# Patient Record
Sex: Female | Born: 1965 | Race: White | Hispanic: No | Marital: Married | Smoking: Never smoker
Health system: Southern US, Community
[De-identification: ages and names within clinical notes are randomized; demographics above are authoritative.]

## PROBLEM LIST (undated history)

## (undated) DIAGNOSIS — L509 Urticaria, unspecified: Secondary | ICD-10-CM

## (undated) DIAGNOSIS — E785 Hyperlipidemia, unspecified: Secondary | ICD-10-CM

## (undated) DIAGNOSIS — M1990 Unspecified osteoarthritis, unspecified site: Secondary | ICD-10-CM

## (undated) DIAGNOSIS — K649 Unspecified hemorrhoids: Secondary | ICD-10-CM

## (undated) DIAGNOSIS — K219 Gastro-esophageal reflux disease without esophagitis: Secondary | ICD-10-CM

## (undated) DIAGNOSIS — T7840XA Allergy, unspecified, initial encounter: Secondary | ICD-10-CM

## (undated) DIAGNOSIS — E039 Hypothyroidism, unspecified: Secondary | ICD-10-CM

## (undated) DIAGNOSIS — Z9289 Personal history of other medical treatment: Secondary | ICD-10-CM

## (undated) HISTORY — DX: Hypothyroidism, unspecified: E03.9

## (undated) HISTORY — DX: Personal history of other medical treatment: Z92.89

## (undated) HISTORY — DX: Hyperlipidemia, unspecified: E78.5

## (undated) HISTORY — DX: Allergy, unspecified, initial encounter: T78.40XA

## (undated) HISTORY — DX: Gastro-esophageal reflux disease without esophagitis: K21.9

## (undated) HISTORY — DX: Unspecified osteoarthritis, unspecified site: M19.90

## (undated) HISTORY — PX: OTHER SURGICAL HISTORY: SHX169

## (undated) HISTORY — DX: Unspecified hemorrhoids: K64.9

## (undated) HISTORY — DX: Urticaria, unspecified: L50.9

---

## 2003-07-02 ENCOUNTER — Other Ambulatory Visit: Admission: RE | Admit: 2003-07-02 | Discharge: 2003-07-02 | Payer: Self-pay | Admitting: Family Medicine

## 2003-12-10 ENCOUNTER — Encounter: Admission: RE | Admit: 2003-12-10 | Discharge: 2003-12-10 | Payer: Self-pay | Admitting: Family Medicine

## 2004-06-23 ENCOUNTER — Ambulatory Visit: Payer: Self-pay | Admitting: Family Medicine

## 2004-07-20 ENCOUNTER — Ambulatory Visit: Payer: Self-pay | Admitting: Family Medicine

## 2004-07-20 ENCOUNTER — Encounter: Admission: RE | Admit: 2004-07-20 | Discharge: 2004-07-20 | Payer: Self-pay | Admitting: Family Medicine

## 2004-08-18 ENCOUNTER — Ambulatory Visit: Payer: Self-pay | Admitting: Family Medicine

## 2004-10-04 ENCOUNTER — Ambulatory Visit: Payer: Self-pay | Admitting: Family Medicine

## 2004-10-19 ENCOUNTER — Ambulatory Visit: Payer: Self-pay | Admitting: Family Medicine

## 2004-10-26 ENCOUNTER — Ambulatory Visit: Payer: Self-pay | Admitting: Family Medicine

## 2005-01-25 ENCOUNTER — Encounter: Admission: RE | Admit: 2005-01-25 | Discharge: 2005-01-25 | Payer: Self-pay | Admitting: Family Medicine

## 2005-01-25 ENCOUNTER — Ambulatory Visit: Payer: Self-pay | Admitting: Family Medicine

## 2005-05-26 ENCOUNTER — Ambulatory Visit: Payer: Self-pay | Admitting: Family Medicine

## 2006-05-01 ENCOUNTER — Ambulatory Visit: Payer: Self-pay | Admitting: Family Medicine

## 2006-05-01 LAB — CONVERTED CEMR LAB
AST: 21 units/L (ref 0–37)
Albumin: 3.9 g/dL (ref 3.5–5.2)
Alkaline Phosphatase: 85 units/L (ref 39–117)
TSH: 3.47 microintl units/mL (ref 0.35–5.50)
Total Bilirubin: 0.6 mg/dL (ref 0.3–1.2)

## 2006-08-08 ENCOUNTER — Ambulatory Visit: Payer: Self-pay | Admitting: Family Medicine

## 2006-08-08 LAB — CONVERTED CEMR LAB
BUN: 11 mg/dL (ref 6–23)
Calcium: 9.3 mg/dL (ref 8.4–10.5)
Eosinophils Absolute: 0.1 10*3/uL (ref 0.0–0.6)
GFR calc Af Amer: 101 mL/min
GFR calc non Af Amer: 83 mL/min
Glucose, Bld: 80 mg/dL (ref 70–99)
Lymphocytes Relative: 20.1 % (ref 12.0–46.0)
MCHC: 34.8 g/dL (ref 30.0–36.0)
MCV: 84.3 fL (ref 78.0–100.0)
Monocytes Relative: 7.7 % (ref 3.0–11.0)
Neutro Abs: 4.8 10*3/uL (ref 1.4–7.7)
Platelets: 203 10*3/uL (ref 150–400)

## 2006-12-11 ENCOUNTER — Ambulatory Visit: Payer: Self-pay | Admitting: Family Medicine

## 2006-12-20 ENCOUNTER — Telehealth (INDEPENDENT_AMBULATORY_CARE_PROVIDER_SITE_OTHER): Payer: Self-pay | Admitting: *Deleted

## 2007-02-11 ENCOUNTER — Telehealth (INDEPENDENT_AMBULATORY_CARE_PROVIDER_SITE_OTHER): Payer: Self-pay | Admitting: *Deleted

## 2007-02-14 ENCOUNTER — Ambulatory Visit: Payer: Self-pay | Admitting: Internal Medicine

## 2007-02-14 DIAGNOSIS — T7840XA Allergy, unspecified, initial encounter: Secondary | ICD-10-CM | POA: Insufficient documentation

## 2007-02-20 ENCOUNTER — Telehealth: Payer: Self-pay | Admitting: Internal Medicine

## 2007-03-01 ENCOUNTER — Telehealth: Payer: Self-pay | Admitting: Internal Medicine

## 2007-03-04 ENCOUNTER — Ambulatory Visit: Payer: Self-pay | Admitting: Family Medicine

## 2007-03-07 ENCOUNTER — Telehealth (INDEPENDENT_AMBULATORY_CARE_PROVIDER_SITE_OTHER): Payer: Self-pay | Admitting: *Deleted

## 2007-03-26 ENCOUNTER — Ambulatory Visit: Payer: Self-pay | Admitting: Family Medicine

## 2007-03-26 DIAGNOSIS — E785 Hyperlipidemia, unspecified: Secondary | ICD-10-CM

## 2007-04-04 ENCOUNTER — Telehealth: Payer: Self-pay | Admitting: Family Medicine

## 2007-04-05 ENCOUNTER — Ambulatory Visit: Payer: Self-pay | Admitting: Family Medicine

## 2007-04-08 ENCOUNTER — Telehealth (INDEPENDENT_AMBULATORY_CARE_PROVIDER_SITE_OTHER): Payer: Self-pay | Admitting: *Deleted

## 2007-04-08 LAB — CONVERTED CEMR LAB: Free T4: 0.9 ng/dL (ref 0.6–1.6)

## 2007-04-12 DIAGNOSIS — R945 Abnormal results of liver function studies: Secondary | ICD-10-CM | POA: Insufficient documentation

## 2007-04-22 ENCOUNTER — Encounter: Admission: RE | Admit: 2007-04-22 | Discharge: 2007-04-22 | Payer: Self-pay | Admitting: Family Medicine

## 2007-04-25 DIAGNOSIS — D1803 Hemangioma of intra-abdominal structures: Secondary | ICD-10-CM

## 2007-05-09 ENCOUNTER — Ambulatory Visit: Payer: Self-pay | Admitting: Family Medicine

## 2007-05-10 ENCOUNTER — Encounter: Admission: RE | Admit: 2007-05-10 | Discharge: 2007-05-10 | Payer: Self-pay | Admitting: Family Medicine

## 2007-05-11 LAB — CONVERTED CEMR LAB: Creatinine, Ser: 0.9 mg/dL (ref 0.4–1.2)

## 2007-05-14 ENCOUNTER — Telehealth (INDEPENDENT_AMBULATORY_CARE_PROVIDER_SITE_OTHER): Payer: Self-pay | Admitting: *Deleted

## 2007-06-05 ENCOUNTER — Ambulatory Visit: Payer: Self-pay | Admitting: Family Medicine

## 2007-06-10 ENCOUNTER — Encounter (INDEPENDENT_AMBULATORY_CARE_PROVIDER_SITE_OTHER): Payer: Self-pay | Admitting: *Deleted

## 2007-06-10 LAB — CONVERTED CEMR LAB: TSH: 2.91 microintl units/mL (ref 0.35–5.50)

## 2007-06-20 ENCOUNTER — Encounter: Payer: Self-pay | Admitting: Family Medicine

## 2007-07-10 ENCOUNTER — Ambulatory Visit: Payer: Self-pay | Admitting: Family Medicine

## 2007-07-11 ENCOUNTER — Encounter (INDEPENDENT_AMBULATORY_CARE_PROVIDER_SITE_OTHER): Payer: Self-pay | Admitting: *Deleted

## 2007-08-02 ENCOUNTER — Encounter: Payer: Self-pay | Admitting: Family Medicine

## 2007-10-09 ENCOUNTER — Telehealth (INDEPENDENT_AMBULATORY_CARE_PROVIDER_SITE_OTHER): Payer: Self-pay | Admitting: *Deleted

## 2007-10-15 ENCOUNTER — Ambulatory Visit: Payer: Self-pay | Admitting: Internal Medicine

## 2007-10-15 LAB — CONVERTED CEMR LAB: HDL goal, serum: 50 mg/dL

## 2008-02-21 ENCOUNTER — Ambulatory Visit: Payer: Self-pay | Admitting: Family Medicine

## 2008-02-21 DIAGNOSIS — E039 Hypothyroidism, unspecified: Secondary | ICD-10-CM | POA: Insufficient documentation

## 2008-02-21 DIAGNOSIS — J019 Acute sinusitis, unspecified: Secondary | ICD-10-CM

## 2008-02-21 DIAGNOSIS — L500 Allergic urticaria: Secondary | ICD-10-CM

## 2008-02-21 LAB — CONVERTED CEMR LAB
Albumin: 3.8 g/dL (ref 3.5–5.2)
Alkaline Phosphatase: 65 units/L (ref 39–117)
CO2: 28 meq/L (ref 19–32)
Chloride: 103 meq/L (ref 96–112)
Free T4: 1.3 ng/dL (ref 0.6–1.6)
GFR calc Af Amer: 77 mL/min
GFR calc non Af Amer: 64 mL/min
Glucose, Bld: 82 mg/dL (ref 70–99)
Lymphocytes Relative: 25.4 % (ref 12.0–46.0)
Monocytes Absolute: 0.6 10*3/uL (ref 0.1–1.0)
Monocytes Relative: 7 % (ref 3.0–12.0)
Neutrophils Relative %: 66.1 % (ref 43.0–77.0)
Platelets: 264 10*3/uL (ref 150–400)
Potassium: 3.5 meq/L (ref 3.5–5.1)
RDW: 12.5 % (ref 11.5–14.6)
Sodium: 137 meq/L (ref 135–145)
T3, Free: 2.4 pg/mL (ref 2.3–4.2)
Total Protein: 6.3 g/dL (ref 6.0–8.3)

## 2008-02-24 ENCOUNTER — Encounter (INDEPENDENT_AMBULATORY_CARE_PROVIDER_SITE_OTHER): Payer: Self-pay | Admitting: *Deleted

## 2008-03-26 ENCOUNTER — Telehealth (INDEPENDENT_AMBULATORY_CARE_PROVIDER_SITE_OTHER): Payer: Self-pay | Admitting: *Deleted

## 2008-06-03 ENCOUNTER — Ambulatory Visit: Payer: Self-pay | Admitting: Family Medicine

## 2008-06-03 DIAGNOSIS — M7710 Lateral epicondylitis, unspecified elbow: Secondary | ICD-10-CM | POA: Insufficient documentation

## 2008-06-04 ENCOUNTER — Encounter (INDEPENDENT_AMBULATORY_CARE_PROVIDER_SITE_OTHER): Payer: Self-pay | Admitting: *Deleted

## 2008-06-17 ENCOUNTER — Telehealth (INDEPENDENT_AMBULATORY_CARE_PROVIDER_SITE_OTHER): Payer: Self-pay | Admitting: *Deleted

## 2008-07-20 IMAGING — US US ABDOMEN COMPLETE
1 series · 14 of 25 positions shown · non-contrast
Comparison: none

CLINICAL DATA: Abnormal LFTs.
ABDOMEN ULTRASOUND:
TECHNIQUE: Complete abdominal ultrasound examination was performed including evaluation of the liver, gallbladder, bile ducts, pancreas, kidneys, spleen, IVC, and abdominal aorta.
The gallbladder is well seen and no gallstones are noted.  The liver has a normal echogenic pattern with the exception of a single focus of increased echogenicity posteriorly in the right lobe most consistent with hemangioma of 2.4 x 1.8 x 2.0 cm.  The common bile duct is normal measuring 3.5 mm in diameter.  The IVC, pancreas, and spleen appear normal.  No hydronephrosis is noted.  The right kidney measures 11.5 cm sagittally with the left kidney measuring 12.3 cm.  The abdominal aorta is normal in caliber.

[Series 1: us abdomen complete · 0.37mm/px · 14 of 112 slices shown]
[im 1/112]
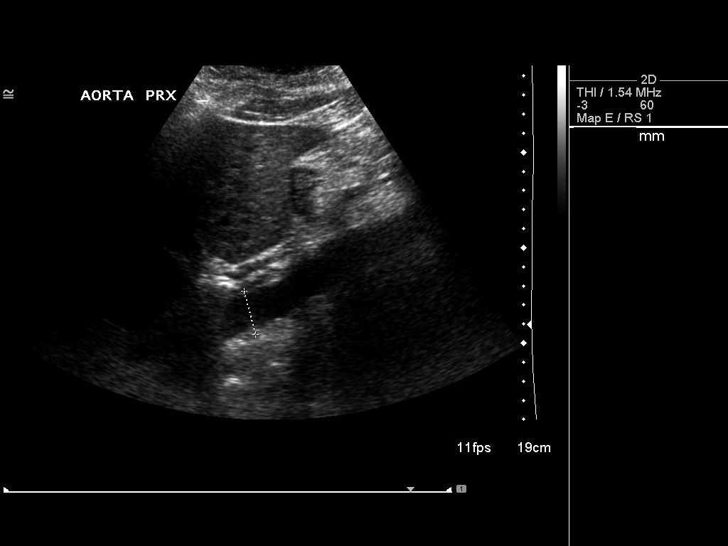
[im 10/112]
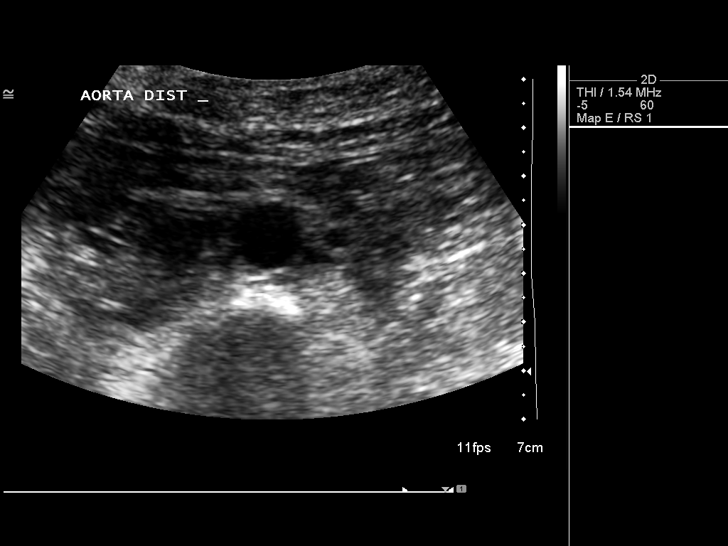
[im 19/112]
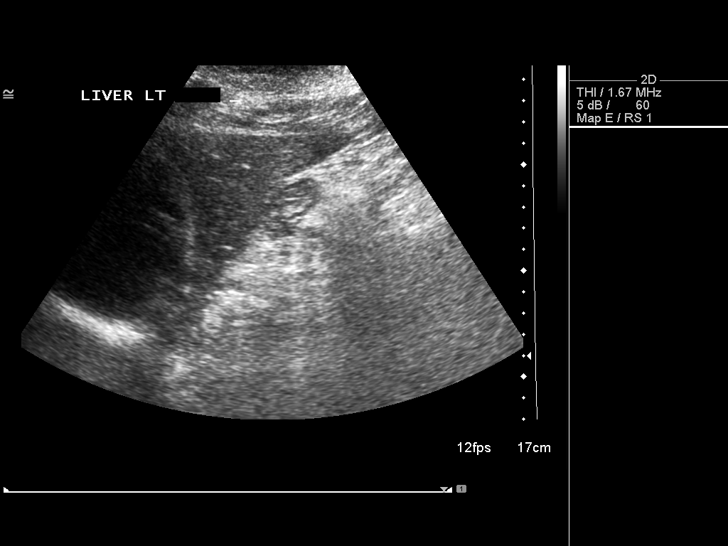
[im 28/112]
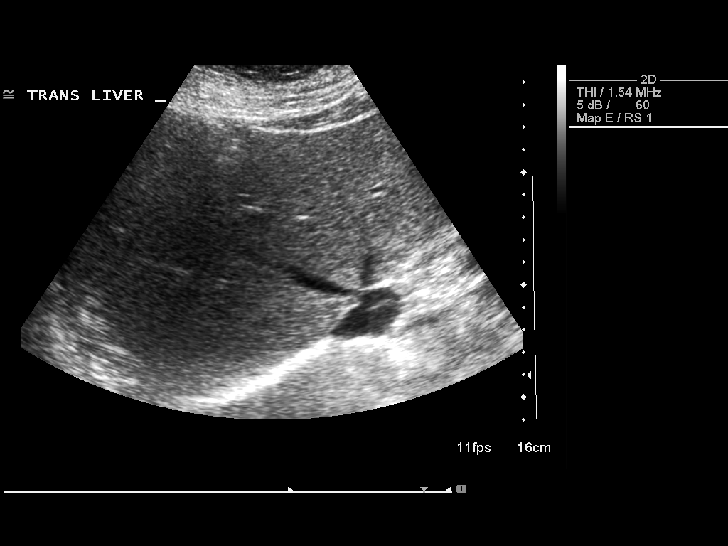
[im 38/112]
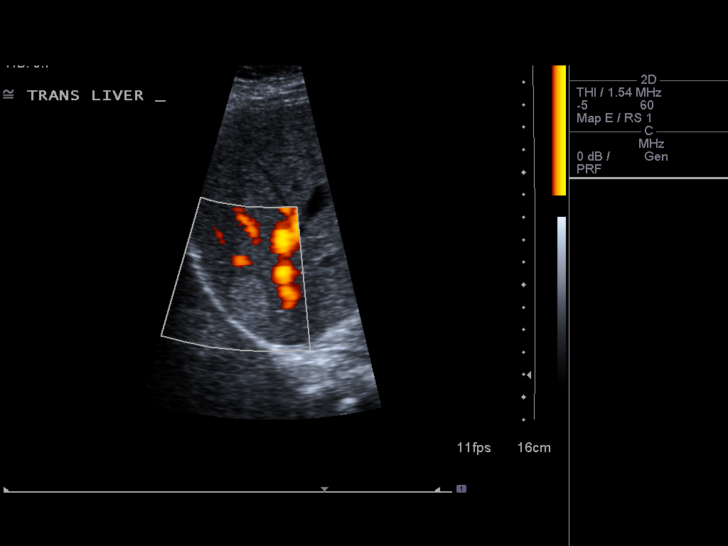
[im 42/112]
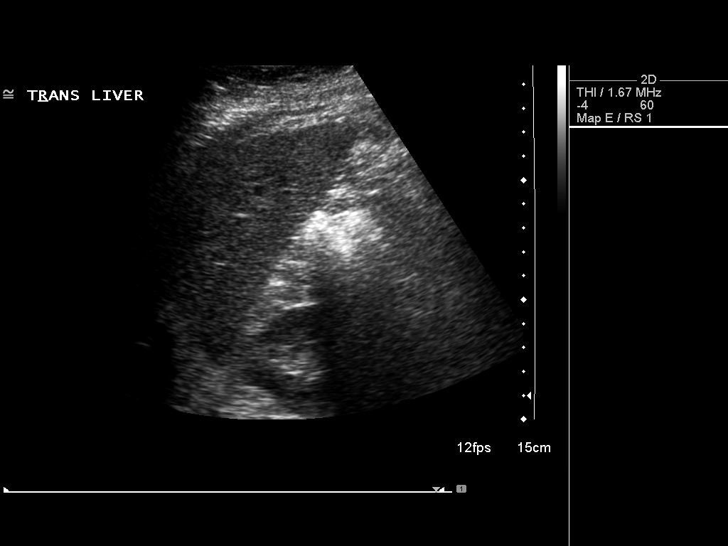
[im 51/112]
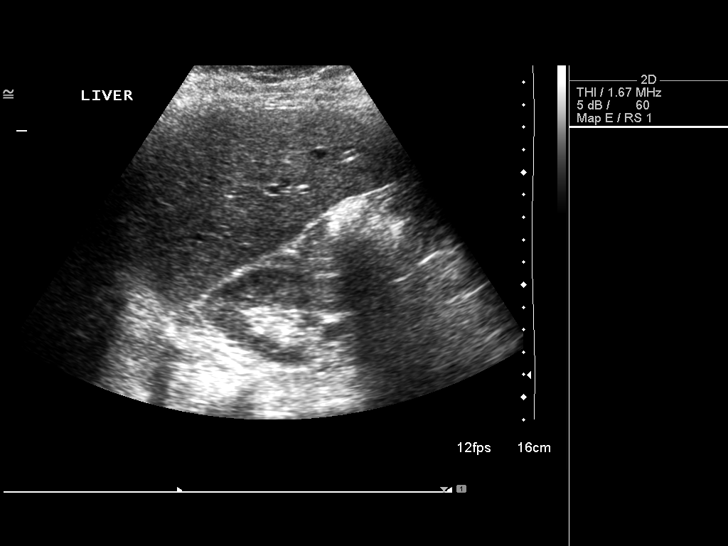
[im 61/112]
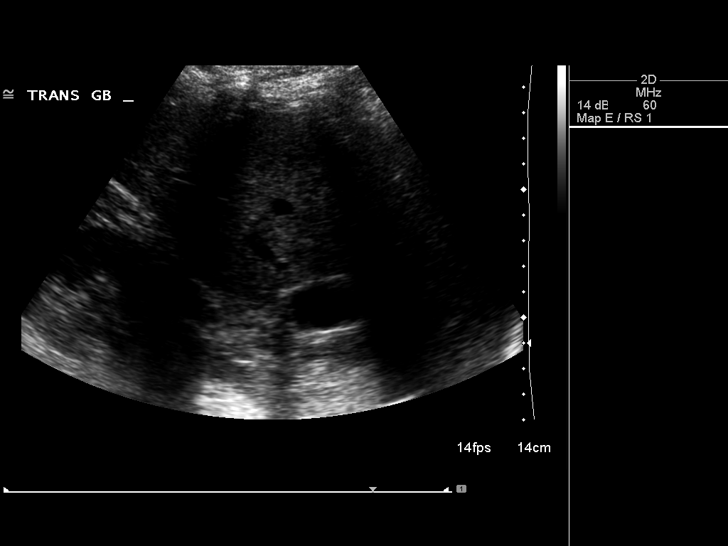
[im 70/112]
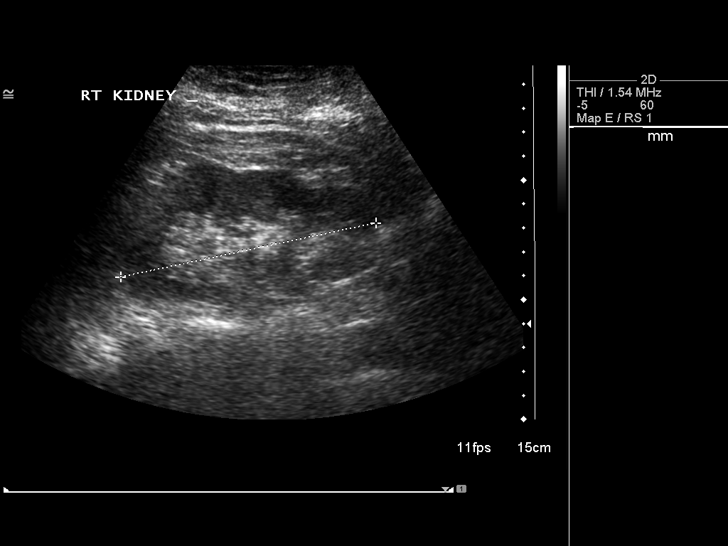
[im 75/112]
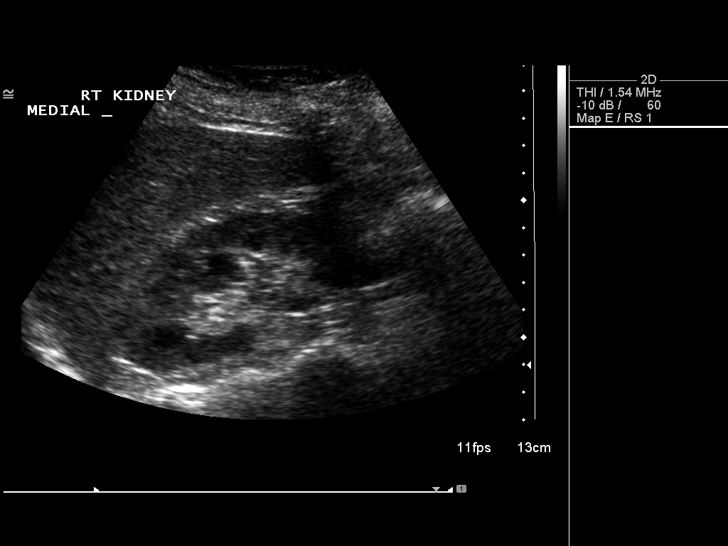
[im 84/112]
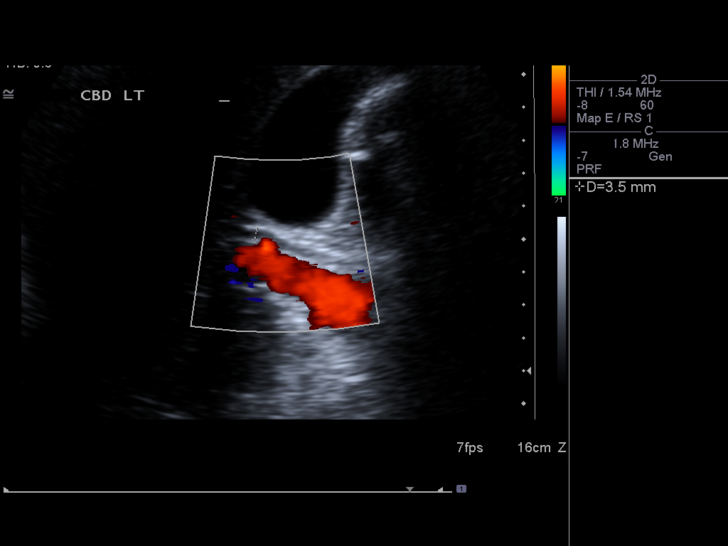
[im 93/112]
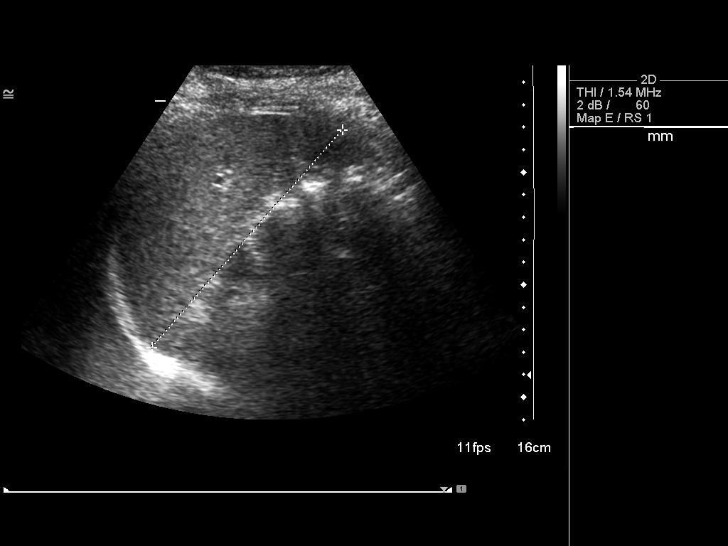
[im 102/112]
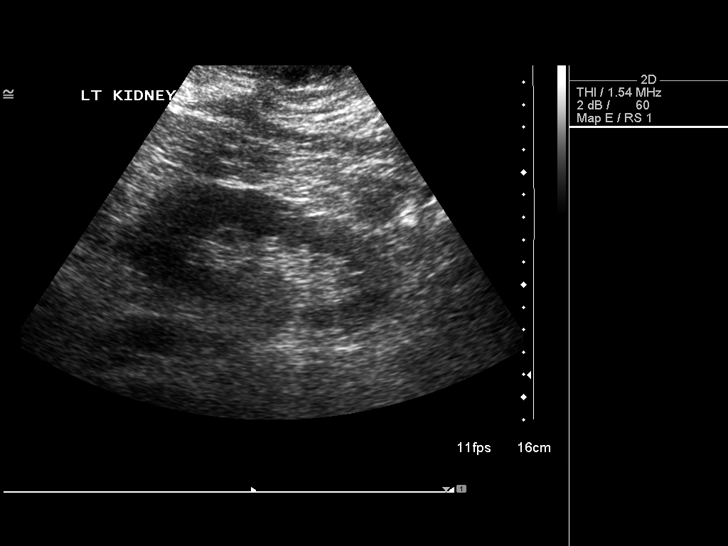
[im 112/112]
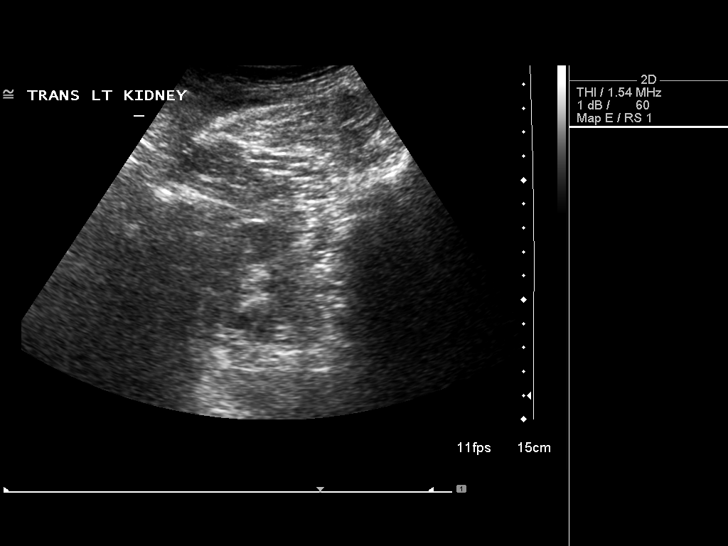

[14 of 25 positions shown; findings below may reference images not displayed]

IMPRESSION: 1.  No gallstones.
2.  Probable right lobe of liver hemangioma of 2.4 cm.  If necessary to assess this hepatic lesion further, MRI of the liver is recommended.

## 2009-02-01 ENCOUNTER — Telehealth (INDEPENDENT_AMBULATORY_CARE_PROVIDER_SITE_OTHER): Payer: Self-pay | Admitting: *Deleted

## 2009-08-13 ENCOUNTER — Ambulatory Visit: Payer: Self-pay | Admitting: Family Medicine

## 2009-08-18 ENCOUNTER — Ambulatory Visit: Payer: Self-pay | Admitting: Family Medicine

## 2009-08-30 ENCOUNTER — Telehealth: Payer: Self-pay | Admitting: Family Medicine

## 2010-06-12 ENCOUNTER — Encounter: Payer: Self-pay | Admitting: Family Medicine

## 2010-06-19 LAB — CONVERTED CEMR LAB
ALT: 24 units/L (ref 0–35)
ALT: 34 units/L (ref 0–35)
AST: 20 units/L (ref 0–37)
AST: 25 units/L (ref 0–37)
Albumin: 3.6 g/dL (ref 3.5–5.2)
Albumin: 3.8 g/dL (ref 3.5–5.2)
Alkaline Phosphatase: 132 units/L — ABNORMAL HIGH (ref 39–117)
Alkaline Phosphatase: 72 units/L (ref 39–117)
Bilirubin, Direct: 0.1 mg/dL (ref 0.0–0.3)
Total Bilirubin: 0.7 mg/dL (ref 0.3–1.2)
Total Protein: 6.8 g/dL (ref 6.0–8.3)

## 2010-06-21 NOTE — Assessment & Plan Note (Signed)
Summary: follow-up on blood work/lch   Vital Signs:  Patient profile:   45 year old female Height:      64 inches Weight:      202 pounds BMI:     34.80 Pulse rate:   68 / minute Pulse rhythm:   regular BP sitting:   132 / 80  (left arm) Cuff size:   regular  Vitals Entered By: Army Fossa CMA (August 18, 2009 11:05 AM) CC: Pt here to follow up on bloodwork, her tendonitis in her left arm is giving her a problem.   History of Present Illness: Pt here to f/u thyroid med and is c/o L elbow tendonitis acting up---strap helps but is loose now.  It was doing great until the last couple of days.    Allergies: 1)  Zocor  Past History:  Past medical, surgical, family and social histories (including risk factors) reviewed for relevance to current acute and chronic problems.  Past Medical History: Reviewed history from 02/21/2008 and no changes required. Hypothyroidism  Family History: Reviewed history and no changes required.  Social History: Reviewed history and no changes required.  Review of Systems      See HPI  Physical Exam  General:  Well-developed,well-nourished,in no acute distress; alert,appropriate and cooperative throughout examination Neck:  No deformities, masses, or tenderness noted. Msk:  + tenderness L lat elbow normal ROM, no joint swelling, no joint warmth, and no redness over joints.   Psych:  Cognition and judgment appear intact. Alert and cooperative with normal attention span and concentration. No apparent delusions, illusions, hallucinations   Impression & Recommendations:  Problem # 1:  LATERAL EPICONDYLITIS, LEFT (ICD-726.32)  elbow strap Ib as needed  ortho if no improvement  Orders: Splints- All Types (W1027)  Problem # 2:  HYPOTHYROIDISM (ICD-244.9)  Her updated medication list for this problem includes:    Synthroid 112 Mcg Tabs (Levothyroxine sodium) .Marland Kitchen... 1 by mouth once daily-  Labs Reviewed: TSH: 1.70 (08/13/2009)      Complete Medication List: 1)  Synthroid 112 Mcg Tabs (Levothyroxine sodium) .Marland Kitchen.. 1 by mouth once daily- 2)  Zyrtec Allergy 10 Mg Tabs (Cetirizine hcl) .... Take one tablet daily. Prescriptions: SYNTHROID 112 MCG  TABS (LEVOTHYROXINE SODIUM) 1 by mouth once daily-  #30 x 11   Entered and Authorized by:   Loreen Freud DO   Signed by:   Loreen Freud DO on 08/18/2009   Method used:   Electronically to        [facility] [phone]* (retail)       [address] [place], [place]  [address]       Ph: [phone]       Fax:    RxID:   [id number]

## 2010-06-21 NOTE — Progress Notes (Signed)
Summary: Needs ortho referral  Phone Note Call from Patient Call back at Work Phone [phone] Message from:  Patient  Caller: Patient Reason for Call: Referral Summary of Call: Patient was told by you if her arm tendionitis wasn't any better tyou would refer her to an ortho MD for it. She is off on Wednesday's if the appt could be then. Thanks! Initial call taken by: Harold Barban,  August 30, 2009 8:13 AM  Follow-up for Phone Call        referral put in Follow-up by: Loreen Freud DO,  August 30, 2009 9:16 AM  Additional Follow-up for Phone Call Additional follow up Details #1::        REFERRAL FAXED TO [facility].  THEY WILL CONTACT PT TO [facility]. Additional Follow-up by: Magdalen Spatz [facility],  August 30, 2009 11:14 AM

## 2010-08-24 ENCOUNTER — Other Ambulatory Visit: Payer: Self-pay | Admitting: Family Medicine

## 2010-09-01 ENCOUNTER — Ambulatory Visit (INDEPENDENT_AMBULATORY_CARE_PROVIDER_SITE_OTHER): Payer: BLUE CROSS/BLUE SHIELD | Admitting: Family Medicine

## 2010-09-01 ENCOUNTER — Encounter: Payer: Self-pay | Admitting: Family Medicine

## 2010-09-01 VITALS — BP 104/70 | Temp 98.9°F | Ht 64.0 in | Wt 209.0 lb

## 2010-09-01 DIAGNOSIS — R609 Edema, unspecified: Secondary | ICD-10-CM

## 2010-09-01 DIAGNOSIS — J019 Acute sinusitis, unspecified: Secondary | ICD-10-CM

## 2010-09-01 DIAGNOSIS — E039 Hypothyroidism, unspecified: Secondary | ICD-10-CM

## 2010-09-01 LAB — BASIC METABOLIC PANEL
Calcium: 9.2 mg/dL (ref 8.4–10.5)
Creatinine, Ser: 0.8 mg/dL (ref 0.4–1.2)
GFR: 82.83 mL/min (ref >60.00)
Glucose, Bld: 83 mg/dL (ref 70–99)
Sodium: 137 mEq/L (ref 135–145)

## 2010-09-01 LAB — T3, FREE: T3, Free: 2.6 pg/mL (ref 2.3–4.2)

## 2010-09-01 LAB — T4, FREE: Free T4: 0.93 ng/dL (ref 0.60–1.60)

## 2010-09-01 MED ORDER — CEFUROXIME AXETIL 500 MG PO TABS: 500.0000 mg | ORAL_TABLET | Freq: Two times a day (BID) | ORAL | Status: AC

## 2010-09-01 MED ORDER — HYDROCHLOROTHIAZIDE 25 MG PO TABS: 25.0000 mg | ORAL_TABLET | Freq: Every day | ORAL | Status: DC

## 2010-09-01 NOTE — Assessment & Plan Note (Signed)
See smart set

## 2010-09-01 NOTE — Patient Instructions (Signed)

## 2010-09-01 NOTE — Progress Notes (Signed)
  2Subjective:     Tracy Mathews is a 45 y.o. female who presents for evaluation of sinus pain. Symptoms include: congestion, cough, nasal congestion, post nasal drip, sinus pressure, sneezing and sore throat. Onset of symptoms was 3 days ago. Symptoms have been gradually worsening since that time. Past history is significant for no history of pneumonia or bronchitis. Patient is a non-smoker. Pt also c/o R low ext edema for about 5 days.  No CP or Sob.  Some pain behind knee cap but no calf pain.  No recent travel.   The following portions of the patient's history were reviewed and updated as appropriate: allergies, current medications, past family history, past medical history, past social history, past surgical history and problem list.  Review of Systems Pertinent items are noted in HPI.   Objective:    BP 104/70  Temp(Src) 98.9 F (37.2 C) (Oral)  Ht 5\' 4"  (1.626 m)  Wt 209 lb (94.802 kg)  BMI 35.87 kg/m2 General appearance: alert, cooperative, appears stated age and no distress Head: Normocephalic, without obvious abnormality, atraumatic Eyes: conjunctivae/corneas clear. PERRL, EOM's intact. Fundi benign. Ears: normal TM's and external ear canals both ears Nose: no discharge, moderate congestion, sinus tenderness bilateral, no polyps, no crusting or bleeding points Throat: abnormal findings: mild oropharyngeal erythema and PND Neck: mild anterior cervical adenopathy, supple, symmetrical, trachea midline and thyroid not enlarged, symmetric, no tenderness/mass/nodules Lungs: clear to auscultation bilaterally Heart: regular rate and rhythm, S1, S2 normal, no murmur, click, rub or gallop Extremities: edema tr on Left and +1 on Right and Homans sign is negative, no sign of DVT Pulses: 2+ and symmetric    Assessment:    Acute bacterial sinusitis.  Hypothyroidism  Edema  Plan:    Neti pot recommended. Instructions given. Nasal steroids per medication orders. Antihistamines per  medication orders. Ceftin per medication orders. Follow up in 1  year or as needed.  Will check TSH today Take HCTZ 1 po qd

## 2010-09-01 NOTE — Assessment & Plan Note (Signed)
Elevate legs  Watch salt  Support hose / compression stockings rto 2 weeks if no better

## 2010-09-02 ENCOUNTER — Encounter: Payer: Self-pay | Admitting: *Deleted

## 2010-10-26 ENCOUNTER — Encounter: Payer: Self-pay | Admitting: Internal Medicine

## 2010-10-26 ENCOUNTER — Ambulatory Visit (INDEPENDENT_AMBULATORY_CARE_PROVIDER_SITE_OTHER): Payer: BLUE CROSS/BLUE SHIELD | Admitting: Internal Medicine

## 2010-10-26 VITALS — BP 122/80 | HR 84 | Temp 98.2°F | Wt 202.2 lb

## 2010-10-26 DIAGNOSIS — M79673 Pain in unspecified foot: Secondary | ICD-10-CM

## 2010-10-26 DIAGNOSIS — M79609 Pain in unspecified limb: Secondary | ICD-10-CM

## 2010-10-26 DIAGNOSIS — M722 Plantar fascial fibromatosis: Secondary | ICD-10-CM

## 2010-10-26 MED ORDER — TRAMADOL HCL 50 MG PO TABS: 50.0000 mg | ORAL_TABLET | Freq: Four times a day (QID) | ORAL | Status: DC | PRN

## 2010-10-26 NOTE — Patient Instructions (Signed)
See recommendations

## 2010-10-26 NOTE — Progress Notes (Signed)
  Subjective:    Patient ID: Tracy Mathews, female    DOB: 1965/09/28, 45 y.o.   MRN: [id number]  HPI Extremity pain R heel Onset:3-4 mos Trigger/injury:no Pain quality:dull to sharp Pain severity:up to 8 Duration:hours Radiation:no Exacerbating factors:starting to ambulate Review of systems: Constitutional: fever, chills, sweats, change in weight :no Musculoskeletal: muscle cramp or pain:no;  joint stiffness, redness, or swelling:no Skin:rash, color change:no Neuro:weakness:no; incontinence (stool/urine):no; numbness and tingling:no Heme:lymphadenopathy: no; abnormal bleeding or clotting:no Treatment/response:NSAIDS with partial benefit     Review of Systems no eye, GU or GI symptoms     Objective:   Physical Exam Gen.: Healthy and well-nourished in appearance. Alert, appropriate and cooperative throughout exam. No clubbing, cyanosis, edema, or deformity noted. Range of motion  Normal @ ankle .Tone & strength  normal.Joints normal. Nail health  Good. Neg Homan's. Tender posterior sole with hyperextension ; tender R calcaneous to  Percussion. Vascular: dorsalis posterior tibial pulses are full and equal. No bruits present. Neurologic: Alert and oriented x3. Deep tendon reflexes symmetrical and normal.  Light touch normal over feet.        Skin: Intact without suspicious lesions or rashes. Lymph: No cervical, axillary, or inguinal lymphadenopathy present. Psych: Mood and affect are normal. Normally interactive                                                                                         Assessment & Plan:  #1calcaneous pain  #2Plantar fasciitis Plan:arch supports; exercises ; Tramadol ;Glucosamine sulfate 1500 mg daily;referral to Sports Medicine if no  better

## 2011-08-10 ENCOUNTER — Other Ambulatory Visit: Payer: Self-pay | Admitting: Family Medicine

## 2011-08-12 ENCOUNTER — Other Ambulatory Visit: Payer: Self-pay | Admitting: Family Medicine

## 2011-09-02 ENCOUNTER — Other Ambulatory Visit: Payer: Self-pay | Admitting: Family Medicine

## 2011-09-10 ENCOUNTER — Other Ambulatory Visit: Payer: Self-pay | Admitting: Family Medicine

## 2011-09-11 NOTE — Telephone Encounter (Signed)
Letter mailed to schedule and apt    KP 

## 2011-09-14 ENCOUNTER — Telehealth: Payer: Self-pay | Admitting: Family Medicine

## 2011-09-14 NOTE — Telephone Encounter (Signed)
This patient is due for a CPE please schedule.   KP

## 2011-09-14 NOTE — Telephone Encounter (Signed)
Patient called to schedule her labs per her letter. Can you please put in the patients lab order.  Thanks Appt 5.3.13

## 2011-09-14 NOTE — Telephone Encounter (Signed)
Thanks you.    KP

## 2011-09-14 NOTE — Telephone Encounter (Signed)
Called patient back & scheduled CPE for June which was the 1st available morning appointment 6.18.2013.

## 2011-09-22 ENCOUNTER — Other Ambulatory Visit: Payer: BLUE CROSS/BLUE SHIELD

## 2011-10-05 ENCOUNTER — Other Ambulatory Visit: Payer: Self-pay | Admitting: Family Medicine

## 2011-11-01 ENCOUNTER — Other Ambulatory Visit: Payer: Self-pay | Admitting: Family Medicine

## 2011-11-07 ENCOUNTER — Other Ambulatory Visit (HOSPITAL_COMMUNITY)
Admission: RE | Admit: 2011-11-07 | Discharge: 2011-11-07 | Disposition: A | Discharge status: Home / Self Care | Payer: BC Managed Care – PPO | Source: Ambulatory Visit | Attending: Family Medicine | Admitting: Family Medicine

## 2011-11-07 ENCOUNTER — Encounter: Payer: Self-pay | Admitting: Family Medicine

## 2011-11-07 ENCOUNTER — Ambulatory Visit (INDEPENDENT_AMBULATORY_CARE_PROVIDER_SITE_OTHER): Payer: BC Managed Care – PPO | Admitting: Family Medicine

## 2011-11-07 VITALS — BP 108/74 | HR 72 | Temp 98.5°F | Ht 63.5 in | Wt 196.8 lb

## 2011-11-07 DIAGNOSIS — K649 Unspecified hemorrhoids: Secondary | ICD-10-CM

## 2011-11-07 DIAGNOSIS — Z124 Encounter for screening for malignant neoplasm of cervix: Secondary | ICD-10-CM

## 2011-11-07 DIAGNOSIS — Z23 Encounter for immunization: Secondary | ICD-10-CM

## 2011-11-07 DIAGNOSIS — Z01419 Encounter for gynecological examination (general) (routine) without abnormal findings: Secondary | ICD-10-CM | POA: Insufficient documentation

## 2011-11-07 DIAGNOSIS — Z0000 Encounter for general adult medical examination without abnormal findings: Secondary | ICD-10-CM

## 2011-11-07 DIAGNOSIS — Z1239 Encounter for other screening for malignant neoplasm of breast: Secondary | ICD-10-CM

## 2011-11-07 DIAGNOSIS — E039 Hypothyroidism, unspecified: Secondary | ICD-10-CM

## 2011-11-07 DIAGNOSIS — M79602 Pain in left arm: Secondary | ICD-10-CM

## 2011-11-07 DIAGNOSIS — M79609 Pain in unspecified limb: Secondary | ICD-10-CM

## 2011-11-07 LAB — BASIC METABOLIC PANEL
CO2: 27 mEq/L (ref 19–32)
Calcium: 8.9 mg/dL (ref 8.4–10.5)
Chloride: 103 mEq/L (ref 96–112)
Creatinine, Ser: 1 mg/dL (ref 0.4–1.2)
Glucose, Bld: 96 mg/dL (ref 70–99)
Sodium: 139 mEq/L (ref 135–145)

## 2011-11-07 LAB — HEPATIC FUNCTION PANEL
ALT: 23 U/L (ref 0–35)
AST: 21 U/L (ref 0–37)
Albumin: 4.2 g/dL (ref 3.5–5.2)

## 2011-11-07 LAB — CBC WITH DIFFERENTIAL/PLATELET
Basophils Absolute: 0 10*3/uL (ref 0.0–0.1)
Basophils Relative: 0.3 % (ref 0.0–3.0)
Eosinophils Absolute: 0.1 10*3/uL (ref 0.0–0.7)
Hemoglobin: 13.4 g/dL (ref 12.0–15.0)
Lymphocytes Relative: 20.3 % (ref 12.0–46.0)
Lymphs Abs: 1.4 10*3/uL (ref 0.7–4.0)
MCHC: 32.8 g/dL (ref 30.0–36.0)
MCV: 87.5 fl (ref 78.0–100.0)
Monocytes Absolute: 0.5 10*3/uL (ref 0.1–1.0)
Neutro Abs: 5 10*3/uL (ref 1.4–7.7)
RDW: 13.9 % (ref 11.5–14.6)

## 2011-11-07 LAB — POCT URINALYSIS DIPSTICK
Bilirubin, UA: NEGATIVE
Blood, UA: NEGATIVE
Glucose, UA: NEGATIVE
Nitrite, UA: NEGATIVE
Spec Grav, UA: <=1.005

## 2011-11-07 LAB — TSH: TSH: 1.95 u[IU]/mL (ref 0.35–5.50)

## 2011-11-07 LAB — LIPID PANEL: Cholesterol: 251 mg/dL — ABNORMAL HIGH (ref 0–200)

## 2011-11-07 MED ORDER — HYDROCORTISONE 2.5 % RE CREA: TOPICAL_CREAM | Freq: Two times a day (BID) | RECTAL | Status: AC

## 2011-11-07 MED ORDER — TRAMADOL HCL 50 MG PO TABS: 50.0000 mg | ORAL_TABLET | Freq: Four times a day (QID) | ORAL | Status: DC | PRN

## 2011-11-07 NOTE — Progress Notes (Signed)
Subjective:     Tracy Mathews is a 46 y.o. female and is here for a comprehensive physical exam. The patient reports no problems.  History   Social History  . Marital Status: Single    Spouse Name: N/A    Number of Children: N/A  . Years of Education: N/A   Occupational History  . manager [employer]    Social History Main Topics  . Smoking status: Never Smoker   . Smokeless tobacco: Not on file  . Alcohol Use: Yes     occ.  . Drug Use: No  . Sexually Active: Yes -- Female partner(s)   Other Topics Concern  . Not on file   Social History Narrative   Exercising--no   Health Maintenance  Topic Date Due  . Mammogram  06/07/1981  . Influenza Vaccine  02/20/2012  . Pap Smear  11/07/2014  . Tetanus/tdap  11/06/2021    The following portions of the patient's history were reviewed and updated as appropriate: allergies, current medications, past family history, past medical history, past social history, past surgical history and problem list.  Review of Systems Review of Systems  Constitutional: Negative for activity change, appetite change and fatigue.  HENT: Negative for hearing loss, congestion, tinnitus and ear discharge.  dentist q62m Eyes: Negative for visual disturbance (see optho q1y -- vision corrected to 20/20 with glasses).  Respiratory: Negative for cough, chest tightness and shortness of breath.   Cardiovascular: Negative for chest pain, palpitations and leg swelling.  Gastrointestinal: Negative for abdominal pain, diarrhea, constipation and abdominal distention.  Genitourinary: Negative for urgency, frequency, decreased urine volume and difficulty urinating.  Musculoskeletal: Negative for back pain, arthralgias and gait problem.  Skin: Negative for color change, pallor and rash.  Neurological: Negative for dizziness, light-headedness, numbness and headaches.  Hematological: Negative for adenopathy. Does not bruise/bleed easily.  Psychiatric/Behavioral:  Negative for suicidal ideas, confusion, sleep disturbance, self-injury, dysphoric mood, decreased concentration and agitation.       Objective:    BP 108/74  Pulse 72  Temp 98.5 F (36.9 C) (Oral)  Ht 5' 3.5" (1.613 m)  Wt 196 lb 12.8 oz (89.268 kg)  BMI 34.31 kg/m2  SpO2 98%  LMP 10/16/2011 General appearance: alert, cooperative, appears stated age and no distress Head: Normocephalic, without obvious abnormality, atraumatic Eyes: conjunctivae/corneas clear. PERRL, EOM's intact. Fundi benign. Ears: normal TM's and external ear canals both ears Nose: Nares normal. Septum midline. Mucosa normal. No drainage or sinus tenderness. Throat: lips, mucosa, and tongue normal; teeth and gums normal Neck: no adenopathy, no carotid bruit, no JVD, supple, symmetrical, trachea midline and thyroid not enlarged, symmetric, no tenderness/mass/nodules Back: symmetric, no curvature. ROM normal. No CVA tenderness. Lungs: clear to auscultation bilaterally Breasts: normal appearance, no masses or tenderness Heart: regular rate and rhythm, S1, S2 normal, no murmur, click, rub or gallop Abdomen: soft, non-tender; bowel sounds normal; no masses,  no organomegaly Pelvic: cervix normal in appearance, external genitalia normal, no adnexal masses or tenderness, no cervical motion tenderness, rectovaginal septum normal, uterus normal size, shape, and consistency and vagina normal without discharge Extremities: L arm--pain and numbness Pulses: 2+ and symmetric Skin: Skin color, texture, turgor normal. No rashes or lesions Lymph nodes: Cervical, supraclavicular, and axillary nodes normal. Neurologic: Alert and oriented X 3, normal strength and tone. Normal symmetric reflexes. Normal coordination and gait psych-- no anxiety and depression    Assessment:    Healthy female exam.     hemorrhoids--  anusol cream-- to surgery / gi if no relief Hypothyroid---check labs Hx L arm pain--- seen [facility] in  past Plan:    check labs gtm utd See After Visit Summary for Counseling Recommendations

## 2011-11-07 NOTE — Patient Instructions (Signed)
Preventive Care for Adults, Female A healthy lifestyle and preventive care can promote health and wellness. Preventive health guidelines for women include the following key practices.  A routine yearly physical is a good way to check with your caregiver about your health and preventive screening. It is a chance to share any concerns and updates on your health, and to receive a thorough exam.   Visit your dentist for a routine exam and preventive care every 6 months. Brush your teeth twice a day and floss once a day. Good oral hygiene prevents tooth decay and gum disease.   The frequency of eye exams is based on your age, health, family medical history, use of contact lenses, and other factors. Follow your caregiver's recommendations for frequency of eye exams.   Eat a healthy diet. Foods like vegetables, fruits, whole grains, low-fat dairy products, and lean protein foods contain the nutrients you need without too many calories. Decrease your intake of foods high in solid fats, added sugars, and salt. Eat the right amount of calories for you.Get information about a proper diet from your caregiver, if necessary.   Regular physical exercise is one of the most important things you can do for your health. Most adults should get at least 150 minutes of moderate-intensity exercise (any activity that increases your heart rate and causes you to sweat) each week. In addition, most adults need muscle-strengthening exercises on 2 or more days a week.   Maintain a healthy weight. The body mass index (BMI) is a screening tool to identify possible weight problems. It provides an estimate of body fat based on height and weight. Your caregiver can help determine your BMI, and can help you achieve or maintain a healthy weight.For adults 20 years and older:   A BMI below 18.5 is considered underweight.   A BMI of 18.5 to 24.9 is normal.   A BMI of 25 to 29.9 is considered overweight.   A BMI of 30 and above is  considered obese.   Maintain normal blood lipids and cholesterol levels by exercising and minimizing your intake of saturated fat. Eat a balanced diet with plenty of fruit and vegetables. Blood tests for lipids and cholesterol should begin at age 20 and be repeated every 5 years. If your lipid or cholesterol levels are high, you are over 50, or you are at high risk for heart disease, you may need your cholesterol levels checked more frequently.Ongoing high lipid and cholesterol levels should be treated with medicines if diet and exercise are not effective.   If you smoke, find out from your caregiver how to quit. If you do not use tobacco, do not start.   If you are pregnant, do not drink alcohol. If you are breastfeeding, be very cautious about drinking alcohol. If you are not pregnant and choose to drink alcohol, do not exceed 1 drink per day. One drink is considered to be 12 ounces (355 mL) of beer, 5 ounces (148 mL) of wine, or 1.5 ounces (44 mL) of liquor.   Avoid use of street drugs. Do not share needles with anyone. Ask for help if you need support or instructions about stopping the use of drugs.   High blood pressure causes heart disease and increases the risk of stroke. Your blood pressure should be checked at least every 1 to 2 years. Ongoing high blood pressure should be treated with medicines if weight loss and exercise are not effective.   If you are 55 to 48   years old, ask your caregiver if you should take aspirin to prevent strokes.   Diabetes screening involves taking a blood sample to check your fasting blood sugar level. This should be done once every 3 years, after age 45, if you are within normal weight and without risk factors for diabetes. Testing should be considered at a younger age or be carried out more frequently if you are overweight and have at least 1 risk factor for diabetes.   Breast cancer screening is essential preventive care for women. You should practice "breast  self-awareness." This means understanding the normal appearance and feel of your breasts and may include breast self-examination. Any changes detected, no matter how small, should be reported to a caregiver. Women in their 20s and 30s should have a clinical breast exam (CBE) by a caregiver as part of a regular health exam every 1 to 3 years. After age 40, women should have a CBE every year. Starting at age 40, women should consider having a mammography (breast X-ray test) every year. Women who have a family history of breast cancer should talk to their caregiver about genetic screening. Women at a high risk of breast cancer should talk to their caregivers about having magnetic resonance imaging (MRI) and a mammography every year.   The Pap test is a screening test for cervical cancer. A Pap test can show cell changes on the cervix that might become cervical cancer if left untreated. A Pap test is a procedure in which cells are obtained and examined from the lower end of the uterus (cervix).   Women should have a Pap test starting at age 21.   Between ages 21 and 29, Pap tests should be repeated every 2 years.   Beginning at age 30, you should have a Pap test every 3 years as long as the past 3 Pap tests have been normal.   Some women have medical problems that increase the chance of getting cervical cancer. Talk to your caregiver about these problems. It is especially important to talk to your caregiver if a new problem develops soon after your last Pap test. In these cases, your caregiver may recommend more frequent screening and Pap tests.   The above recommendations are the same for women who have or have not gotten the vaccine for human papillomavirus (HPV).   If you had a hysterectomy for a problem that was not cancer or a condition that could lead to cancer, then you no longer need Pap tests. Even if you no longer need a Pap test, a regular exam is a good idea to make sure no other problems are  starting.   If you are between ages 65 and 70, and you have had normal Pap tests going back 10 years, you no longer need Pap tests. Even if you no longer need a Pap test, a regular exam is a good idea to make sure no other problems are starting.   If you have had past treatment for cervical cancer or a condition that could lead to cancer, you need Pap tests and screening for cancer for at least 20 years after your treatment.   If Pap tests have been discontinued, risk factors (such as a new sexual partner) need to be reassessed to determine if screening should be resumed.   The HPV test is an additional test that may be used for cervical cancer screening. The HPV test looks for the virus that can cause the cell changes on the cervix.   The cells collected during the Pap test can be tested for HPV. The HPV test could be used to screen women aged 30 years and older, and should be used in women of any age who have unclear Pap test results. After the age of 30, women should have HPV testing at the same frequency as a Pap test.   Colorectal cancer can be detected and often prevented. Most routine colorectal cancer screening begins at the age of 50 and continues through age 75. However, your caregiver may recommend screening at an earlier age if you have risk factors for colon cancer. On a yearly basis, your caregiver may provide home test kits to check for hidden blood in the stool. Use of a small camera at the end of a tube, to directly examine the colon (sigmoidoscopy or colonoscopy), can detect the earliest forms of colorectal cancer. Talk to your caregiver about this at age 50, when routine screening begins. Direct examination of the colon should be repeated every 5 to 10 years through age 75, unless early forms of pre-cancerous polyps or small growths are found.   Hepatitis C blood testing is recommended for all people born from 1945 through 1965 and any individual with known risks for hepatitis C.    Practice safe sex. Use condoms and avoid high-risk sexual practices to reduce the spread of sexually transmitted infections (STIs). STIs include gonorrhea, chlamydia, syphilis, trichomonas, herpes, HPV, and human immunodeficiency virus (HIV). Herpes, HIV, and HPV are viral illnesses that have no cure. They can result in disability, cancer, and death. Sexually active women aged 25 and younger should be checked for chlamydia. Older women with new or multiple partners should also be tested for chlamydia. Testing for other STIs is recommended if you are sexually active and at increased risk.   Osteoporosis is a disease in which the bones lose minerals and strength with aging. This can result in serious bone fractures. The risk of osteoporosis can be identified using a bone density scan. Women ages 65 and over and women at risk for fractures or osteoporosis should discuss screening with their caregivers. Ask your caregiver whether you should take a calcium supplement or vitamin D to reduce the rate of osteoporosis.   Menopause can be associated with physical symptoms and risks. Hormone replacement therapy is available to decrease symptoms and risks. You should talk to your caregiver about whether hormone replacement therapy is right for you.   Use sunscreen with sun protection factor (SPF) of 30 or more. Apply sunscreen liberally and repeatedly throughout the day. You should seek shade when your shadow is shorter than you. Protect yourself by wearing long sleeves, pants, a wide-brimmed hat, and sunglasses year round, whenever you are outdoors.   Once a month, do a whole body skin exam, using a mirror to look at the skin on your back. Notify your caregiver of new moles, moles that have irregular borders, moles that are larger than a pencil eraser, or moles that have changed in shape or color.   Stay current with required immunizations.   Influenza. You need a dose every fall (or winter). The composition of  the flu vaccine changes each year, so being vaccinated once is not enough.   Pneumococcal polysaccharide. You need 1 to 2 doses if you smoke cigarettes or if you have certain chronic medical conditions. You need 1 dose at age 65 (or older) if you have never been vaccinated.   Tetanus, diphtheria, pertussis (Tdap, Td). Get 1 dose of   Tdap vaccine if you are younger than age 65, are over 65 and have contact with an infant, are a healthcare worker, are pregnant, or simply want to be protected from whooping cough. After that, you need a Td booster dose every 10 years. Consult your caregiver if you have not had at least 3 tetanus and diphtheria-containing shots sometime in your life or have a deep or dirty wound.   HPV. You need this vaccine if you are a woman age 26 or younger. The vaccine is given in 3 doses over 6 months.   Measles, mumps, rubella (MMR). You need at least 1 dose of MMR if you were born in 1957 or later. You may also need a second dose.   Meningococcal. If you are age 19 to 21 and a first-year college student living in a residence hall, or have one of several medical conditions, you need to get vaccinated against meningococcal disease. You may also need additional booster doses.   Zoster (shingles). If you are age 60 or older, you should get this vaccine.   Varicella (chickenpox). If you have never had chickenpox or you were vaccinated but received only 1 dose, talk to your caregiver to find out if you need this vaccine.   Hepatitis A. You need this vaccine if you have a specific risk factor for hepatitis A virus infection or you simply wish to be protected from this disease. The vaccine is usually given as 2 doses, 6 to 18 months apart.   Hepatitis B. You need this vaccine if you have a specific risk factor for hepatitis B virus infection or you simply wish to be protected from this disease. The vaccine is given in 3 doses, usually over 6 months.  Preventive Services /  Frequency Ages 19 to 39  Blood pressure check.** / Every 1 to 2 years.   Lipid and cholesterol check.** / Every 5 years beginning at age 20.   Clinical breast exam.** / Every 3 years for women in their 20s and 30s.   Pap test.** / Every 2 years from ages 21 through 29. Every 3 years starting at age 30 through age 65 or 70 with a history of 3 consecutive normal Pap tests.   HPV screening.** / Every 3 years from ages 30 through ages 65 to 70 with a history of 3 consecutive normal Pap tests.   Hepatitis C blood test.** / For any individual with known risks for hepatitis C.   Skin self-exam. / Monthly.   Influenza immunization.** / Every year.   Pneumococcal polysaccharide immunization.** / 1 to 2 doses if you smoke cigarettes or if you have certain chronic medical conditions.   Tetanus, diphtheria, pertussis (Tdap, Td) immunization. / A one-time dose of Tdap vaccine. After that, you need a Td booster dose every 10 years.   HPV immunization. / 3 doses over 6 months, if you are 26 and younger.   Measles, mumps, rubella (MMR) immunization. / You need at least 1 dose of MMR if you were born in 1957 or later. You may also need a second dose.   Meningococcal immunization. / 1 dose if you are age 19 to 21 and a first-year college student living in a residence hall, or have one of several medical conditions, you need to get vaccinated against meningococcal disease. You may also need additional booster doses.   Varicella immunization.** / Consult your caregiver.   Hepatitis A immunization.** / Consult your caregiver. 2 doses, 6 to 18 months   apart.   Hepatitis B immunization.** / Consult your caregiver. 3 doses usually over 6 months.  Ages 40 to 64  Blood pressure check.** / Every 1 to 2 years.   Lipid and cholesterol check.** / Every 5 years beginning at age 20.   Clinical breast exam.** / Every year after age 40.   Mammogram.** / Every year beginning at age 40 and continuing for as  long as you are in good health. Consult with your caregiver.   Pap test.** / Every 3 years starting at age 30 through age 65 or 70 with a history of 3 consecutive normal Pap tests.   HPV screening.** / Every 3 years from ages 30 through ages 65 to 70 with a history of 3 consecutive normal Pap tests.   Fecal occult blood test (FOBT) of stool. / Every year beginning at age 50 and continuing until age 75. You may not need to do this test if you get a colonoscopy every 10 years.   Flexible sigmoidoscopy or colonoscopy.** / Every 5 years for a flexible sigmoidoscopy or every 10 years for a colonoscopy beginning at age 50 and continuing until age 75.   Hepatitis C blood test.** / For all people born from 1945 through 1965 and any individual with known risks for hepatitis C.   Skin self-exam. / Monthly.   Influenza immunization.** / Every year.   Pneumococcal polysaccharide immunization.** / 1 to 2 doses if you smoke cigarettes or if you have certain chronic medical conditions.   Tetanus, diphtheria, pertussis (Tdap, Td) immunization.** / A one-time dose of Tdap vaccine. After that, you need a Td booster dose every 10 years.   Measles, mumps, rubella (MMR) immunization. / You need at least 1 dose of MMR if you were born in 1957 or later. You may also need a second dose.   Varicella immunization.** / Consult your caregiver.   Meningococcal immunization.** / Consult your caregiver.   Hepatitis A immunization.** / Consult your caregiver. 2 doses, 6 to 18 months apart.   Hepatitis B immunization.** / Consult your caregiver. 3 doses, usually over 6 months.  Ages 65 and over  Blood pressure check.** / Every 1 to 2 years.   Lipid and cholesterol check.** / Every 5 years beginning at age 20.   Clinical breast exam.** / Every year after age 40.   Mammogram.** / Every year beginning at age 40 and continuing for as long as you are in good health. Consult with your caregiver.   Pap test.** /  Every 3 years starting at age 30 through age 65 or 70 with a 3 consecutive normal Pap tests. Testing can be stopped between 65 and 70 with 3 consecutive normal Pap tests and no abnormal Pap or HPV tests in the past 10 years.   HPV screening.** / Every 3 years from ages 30 through ages 65 or 70 with a history of 3 consecutive normal Pap tests. Testing can be stopped between 65 and 70 with 3 consecutive normal Pap tests and no abnormal Pap or HPV tests in the past 10 years.   Fecal occult blood test (FOBT) of stool. / Every year beginning at age 50 and continuing until age 75. You may not need to do this test if you get a colonoscopy every 10 years.   Flexible sigmoidoscopy or colonoscopy.** / Every 5 years for a flexible sigmoidoscopy or every 10 years for a colonoscopy beginning at age 50 and continuing until age 75.   Hepatitis   C blood test.** / For all people born from 1945 through 1965 and any individual with known risks for hepatitis C.   Osteoporosis screening.** / A one-time screening for women ages 65 and over and women at risk for fractures or osteoporosis.   Skin self-exam. / Monthly.   Influenza immunization.** / Every year.   Pneumococcal polysaccharide immunization.** / 1 dose at age 65 (or older) if you have never been vaccinated.   Tetanus, diphtheria, pertussis (Tdap, Td) immunization. / A one-time dose of Tdap vaccine if you are over 65 and have contact with an infant, are a healthcare worker, or simply want to be protected from whooping cough. After that, you need a Td booster dose every 10 years.   Varicella immunization.** / Consult your caregiver.   Meningococcal immunization.** / Consult your caregiver.   Hepatitis A immunization.** / Consult your caregiver. 2 doses, 6 to 18 months apart.   Hepatitis B immunization.** / Check with your caregiver. 3 doses, usually over 6 months.  ** Family history and personal history of risk and conditions may change your caregiver's  recommendations. Document Released: 07/04/2001 Document Revised: 04/27/2011 Document Reviewed: 10/03/2010 [employer] Patient Information 2012 [employer]. 

## 2011-11-08 LAB — LDL CHOLESTEROL, DIRECT: Direct LDL: 195.1 mg/dL

## 2011-11-17 ENCOUNTER — Telehealth: Payer: Self-pay

## 2011-11-17 DIAGNOSIS — Z1231 Encounter for screening mammogram for malignant neoplasm of breast: Secondary | ICD-10-CM

## 2011-11-17 DIAGNOSIS — K649 Unspecified hemorrhoids: Secondary | ICD-10-CM

## 2011-11-17 MED ORDER — POTASSIUM CHLORIDE CRYS ER 20 MEQ PO TBCR: 20.0000 meq | EXTENDED_RELEASE_TABLET | Freq: Every day | ORAL | Status: DC

## 2011-11-17 NOTE — Telephone Encounter (Signed)
Discussed with patient and she stated she was still having issues with Hemorrhoids and would like GI referral. Wants mammogram done in the  [place] area and Rx has been faxed. Copy of labs mailed   KP

## 2011-11-17 NOTE — Telephone Encounter (Signed)
Message copied by Arnette Norris on Fri Nov 17, 2011  5:04 PM ------      Message from: Lelon Perla      Created: Wed Nov 08, 2011  9:51 PM       Neg pap      K is low---- add KCL  20 meq  1 po qd , #30  1 refills      Cholesterol--- LDL goal < 100,  HDL >40,  TG < 150.  Diet and exercise will increase HDL and decrease LDL and TG.  Fish,  Fish Oil, Flaxseed oil will also help increase the HDL and decrease Triglycerides.     Recheck labs in 3 months.   If still high we will need to start medication.        ---272.4  Lipid, hep   401.9  bmp

## 2011-11-20 ENCOUNTER — Encounter: Payer: Self-pay | Admitting: Internal Medicine

## 2011-11-30 ENCOUNTER — Other Ambulatory Visit: Payer: Self-pay | Admitting: Family Medicine

## 2011-12-06 ENCOUNTER — Telehealth: Payer: Self-pay | Admitting: *Deleted

## 2011-12-06 NOTE — Telephone Encounter (Signed)
Pt need to complete form for work and needed to know some of her lab values. Pt given values.

## 2011-12-06 NOTE — Telephone Encounter (Signed)
Pt left VM on triage line stating that she has some question about her results and would like to get a call back to discuss. Left message to call office.

## 2011-12-19 ENCOUNTER — Encounter: Payer: Self-pay | Admitting: Internal Medicine

## 2011-12-19 ENCOUNTER — Ambulatory Visit (INDEPENDENT_AMBULATORY_CARE_PROVIDER_SITE_OTHER): Payer: BC Managed Care – PPO | Admitting: Internal Medicine

## 2011-12-19 VITALS — BP 110/76 | HR 66 | Wt 198.8 lb

## 2011-12-19 DIAGNOSIS — K602 Anal fissure, unspecified: Secondary | ICD-10-CM

## 2011-12-19 DIAGNOSIS — K6289 Other specified diseases of anus and rectum: Secondary | ICD-10-CM

## 2011-12-19 DIAGNOSIS — K219 Gastro-esophageal reflux disease without esophagitis: Secondary | ICD-10-CM

## 2011-12-19 DIAGNOSIS — K625 Hemorrhage of anus and rectum: Secondary | ICD-10-CM

## 2011-12-19 MED ORDER — PSYLLIUM 58.6 % PO POWD: 1.0000 | Freq: Three times a day (TID) | ORAL | Status: AC

## 2011-12-19 MED ORDER — AMBULATORY NON FORMULARY MEDICATION: Status: DC

## 2011-12-19 NOTE — Patient Instructions (Addendum)
We have sent the following medications to your pharmacy for you to pick up at your convenience:  Diltiazem cream.  This prescription needs to be picked up at [facility]  Please begin taking Metamucil, over the counter, 2 tablespoons a day  If you are not feeling better in 6 weeks, please give [place] a call  Sitz Bath A sitz bath is a warm water bath taken in the sitting position that covers only the hips and buttocks. It may be used for either healing or hygiene purposes. Sitz baths are also used to relieve pain, itching, or muscle spasms. The water may contain medicine. Moist heat will help you heal and relax.  HOME CARE INSTRUCTIONS   Fill the bathtub half full with warm water.   Sit in the water and open the drain a little.   Turn on the warm water to keep the tub half full. Keep the water running constantly.   Soak in the water for 15 to 20 minutes.   After the sitz bath, pat the affected area dry first.   Take 3 to 4 sitz baths a day.  SEEK MEDICAL CARE IF:  You get worse instead of better. Stop the sitz baths if you get worse. MAKE SURE YOU:  Understand these instructions.   Will watch your condition.   Will get help right away if you are not doing well or get worse.  Document Released: 01/29/2004 Document Revised: 04/27/2011 Document Reviewed: 08/05/2010 [facility] Patient Information 2012 [place], [place].

## 2011-12-19 NOTE — Progress Notes (Signed)
HISTORY OF PRESENT ILLNESS:  Tracy Mathews is a 46 y.o. female with the below listed medical history who presents today, upon consultative referral from Dr. Laury Axon, for evaluation of rectal pain and bleeding, presumably due to hemorrhoids. Patient states that she has had this problem for about a year and a half. She notices that around the time of her menstrual period she will become somewhat constipated. Thereafter, developing pain with defecation associated with varying amounts of minor bleeding on the tissue. This is bright red. Problem they bother her regularly for weeks or even months. However, she may go weeks or months without problems. Prior to scheduling this appointment she was having troubles. She states that she is doing better at this time. She denies nausea, vomiting, abdominal pain, change in appetite, weight loss, or a family history of colon cancer or colon polyps. She has had no prior surgeries. She denies a prior history of GI evaluations. She does take Prilosec on demand for GERD.  REVIEW OF SYSTEMS:  All non-GI ROS negative except for arthritis and night sweats  Past Medical History  Diagnosis Date  . Hypothyroidism   . Hives     chronic  . Allergy   . Hemorrhoid   . Arthritis   . Hyperlipidemia     Past Surgical History  Procedure Date  . None     Social History Tracy Mathews  reports that she has never smoked. She has never used smokeless tobacco. She reports that she drinks alcohol. She reports that she does not use illicit drugs.  family history includes Diabetes in her brother and mother; Heart attack in her mother; Heart disease (age of onset:80) in her mother; Hyperlipidemia in her sister; Hypertension in her brothers, mother, and sister; and Stroke in her father and mother.  Allergies  Allergen Reactions  . Simvastatin     REACTION: cramps       PHYSICAL EXAMINATION: Vital signs: BP 110/76  Pulse 66  Wt 198 lb 12.8 oz (90.175 kg)  Constitutional:  generally well-appearing,obese, no acute distress Psychiatric: alert and oriented x3, cooperative Eyes: extraocular movements intact, anicteric, conjunctiva pink Mouth: oral pharynx moist, no lesions Neck: supple no lymphadenopathy Cardiovascular: heart regular rate and rhythm, no murmur Lungs: clear to auscultation bilaterally Abdomen: soft,obese, nontender, nondistended, no obvious ascites, no peritoneal signs, normal bowel sounds, no organomegaly Rectal:obvious posterior anal fissure which is tender and reproduces her rectal pain. No hemorrhoids. Soft Hemoccult negative stool Extremities: no lower extremity edema bilaterally Skin: no lesions on visible extremities Neuro: No focal deficits.    ASSESSMENT:  #1. Anal fissure. This as a cause for intermittent rectal pain and minor bleeding #2. Colon cancer screening. Offered at this time. She will wait until age 68. #3. GERD without alarm symptoms. Managed with on demand PPI   PLAN:  #1. Discussion on the pathophysiology and treatment of anal fissure #2. Literature provided on anal fissure, for her review #3. Metamucil one to 2 tablespoons daily #4. Sitz baths twice a day #5. 2% diltiazem cream to be applied to the anal sphincter 5 times daily #6. Contact the office in 6 weeks if problem has not resolved. She may need surgical referral. #7. Screening colonoscopy at age 72. Advised. Understood.  #8. Reflux precautions

## 2011-12-28 ENCOUNTER — Encounter: Payer: Self-pay | Admitting: Family Medicine

## 2012-01-09 ENCOUNTER — Other Ambulatory Visit: Payer: Self-pay | Admitting: Family Medicine

## 2012-02-18 ENCOUNTER — Other Ambulatory Visit: Payer: Self-pay | Admitting: Family Medicine

## 2012-03-20 ENCOUNTER — Telehealth: Payer: Self-pay | Admitting: Family Medicine

## 2012-03-20 MED ORDER — POTASSIUM CHLORIDE CRYS ER 20 MEQ PO TBCR: EXTENDED_RELEASE_TABLET | ORAL | Status: DC

## 2012-03-20 NOTE — Telephone Encounter (Signed)
Refill: Potassium cl 20 meq er tablets. TK 1 T PO D. Qty 30. Last fill 03-15-12

## 2012-04-11 ENCOUNTER — Other Ambulatory Visit: Payer: Self-pay | Admitting: Family Medicine

## 2012-04-26 ENCOUNTER — Ambulatory Visit (INDEPENDENT_AMBULATORY_CARE_PROVIDER_SITE_OTHER): Payer: BC Managed Care – PPO | Admitting: Internal Medicine

## 2012-04-26 ENCOUNTER — Encounter: Payer: Self-pay | Admitting: Internal Medicine

## 2012-04-26 VITALS — BP 122/86 | HR 77 | Temp 98.2°F | Wt 200.0 lb

## 2012-04-26 DIAGNOSIS — J310 Chronic rhinitis: Secondary | ICD-10-CM

## 2012-04-26 DIAGNOSIS — M546 Pain in thoracic spine: Secondary | ICD-10-CM

## 2012-04-26 DIAGNOSIS — J209 Acute bronchitis, unspecified: Secondary | ICD-10-CM

## 2012-04-26 MED ORDER — TRAMADOL HCL 50 MG PO TABS: 50.0000 mg | ORAL_TABLET | Freq: Four times a day (QID) | ORAL | Status: DC | PRN

## 2012-04-26 MED ORDER — AZITHROMYCIN 250 MG PO TABS: ORAL_TABLET | ORAL | Status: DC

## 2012-04-26 MED ORDER — CYCLOBENZAPRINE HCL 5 MG PO TABS: ORAL_TABLET | ORAL | Status: DC

## 2012-04-26 MED ORDER — FLUTICASONE PROPIONATE 50 MCG/ACT NA SUSP: 1.0000 | Freq: Two times a day (BID) | NASAL | Status: DC | PRN

## 2012-04-26 NOTE — Patient Instructions (Addendum)
Plain Mucinex for thick secretions ;force NON dairy fluids . Use a Neti pot daily as needed for sinus congestion; going from open side to congested side . Nasal cleansing in the shower as discussed. Make sure that all residual soap is removed to prevent irritation. Fluticasone 1 spray in each nostril twice a day as needed. Use the "crossover" technique as discussed.  Unfortunately laryngitis is best treated with voice rest. Stay hydrated drinking to thirst, up to 32 ounces of non dairy fluids per day.    Review and correct the record as indicated. Please share record with all medical staff seen.

## 2012-04-26 NOTE — Progress Notes (Signed)
Subjective:    Patient ID: Tracy Mathews, female    DOB: 1965/12/10, 46 y.o.   MRN: [id number]  HPI #1 Back symptoms began 04/22/12 w/o trigger or injury in the left infrascapular pain which has been dull-severe up to a level 10 with spasms. It is intermittent  and is nonradiating. She took muscle relaxants prescribed @ an urgent care and 2 tramadol and went to bed. That resolved the spasm but she continues to have some dull pain. Significantly she's been treated for bulging cervical disc by Dr. Penni Bombard, orthopedist with Physical Therapy.  #2 RTI : Nasal congestion appeared 04/24/12 followed by laryngitis 12/5. She's had a cough with sputum. She denies significant shortness of breath or wheezing. She has continued to take her generic Zyrtec which is taken for chronic urticaria.   Review of Systems Fecal/urinary incontinence: no Numbness/Weakness: chronic tingling LUE from cervical disc  Fever/chills/sweats: no  Unexplained weight loss: no  PMH of r chronic steroid use: for sinusinfections   She denies associated fever, chills, or sweats. She's had scant nasal discharge without frontal headache or facial pain. She has not had significant extrinsic symptoms of itchy, watery eyes, sneezing.       Objective:   Physical Exam Gen.:  well-nourished in appearance. Alert, appropriate and cooperative throughout exam.  Eyes: No corneal or conjunctival inflammation noted.  Extraocular motion intact.  Ears: External  ear exam reveals no significant lesions or deformities. Canals clear .TMs normal. Hearing is grossly normal bilaterally. Nose: External nasal exam reveals no deformity or inflammation. Nasal mucosa are pink and moist. No lesions or exudates noted.  Mouth: Oral mucosa and oropharynx reveal no lesions or exudates. Teeth in good repair.Hoarse Neck: No deformities, masses, or tenderness noted. Range of motion surprisingly good.  Lungs: Normal respiratory effort; chest expands symmetrically.  Lungs are clear to auscultation without rales, wheezes, or increased work of breathing.Dry cough Heart: Normal rate and rhythm. Normal S1 and S2. No gallop, click, or rub. Grade 1/2 over 6 systolic murmur .                                                                        Musculoskeletal/extremities: No deformity or scoliosis noted of  the thoracic or lumbar spine. No clubbing, cyanosis, edema, or deformity noted. Range of motion  normal .Tone & strength  normal.Joints normal. Nail health  good. Neurologic: Alert and oriented x3. Deep tendon reflexes symmetrical and normal. No cranial nerve deficit present         Skin: Intact without suspicious lesions or Lu back  rash. Lymph: No cervical, axillary lymphadenopathy present. Psych: Mood and affect are normal. Normally interactive                                                                                         Assessment & Plan:  #1 left upper back pain in  T2-3 distribution. No neuromuscular deficit present. This is in the context of a history of cervical disc disease  #2 bronchitis with minor upper respiratory tract symptoms & laryngitis.  Plan: See orders and recommendations

## 2012-06-02 ENCOUNTER — Other Ambulatory Visit: Payer: Self-pay | Admitting: Family Medicine

## 2012-06-03 NOTE — Telephone Encounter (Signed)
Last labs done 11/08/11. Please advise      KP

## 2012-06-15 ENCOUNTER — Other Ambulatory Visit: Payer: Self-pay | Admitting: Family Medicine

## 2012-06-17 ENCOUNTER — Encounter: Payer: Self-pay | Admitting: Lab

## 2012-06-18 ENCOUNTER — Encounter: Payer: Self-pay | Admitting: Family Medicine

## 2012-06-18 ENCOUNTER — Ambulatory Visit (INDEPENDENT_AMBULATORY_CARE_PROVIDER_SITE_OTHER): Payer: BC Managed Care – PPO | Admitting: Family Medicine

## 2012-06-18 VITALS — BP 130/92 | HR 79 | Temp 98.1°F | Wt 200.4 lb

## 2012-06-18 DIAGNOSIS — E039 Hypothyroidism, unspecified: Secondary | ICD-10-CM

## 2012-06-18 DIAGNOSIS — M25539 Pain in unspecified wrist: Secondary | ICD-10-CM

## 2012-06-18 DIAGNOSIS — Z9109 Other allergy status, other than to drugs and biological substances: Secondary | ICD-10-CM

## 2012-06-18 DIAGNOSIS — M6530 Trigger finger, unspecified finger: Secondary | ICD-10-CM | POA: Insufficient documentation

## 2012-06-18 DIAGNOSIS — E876 Hypokalemia: Secondary | ICD-10-CM

## 2012-06-18 DIAGNOSIS — Z0000 Encounter for general adult medical examination without abnormal findings: Secondary | ICD-10-CM

## 2012-06-18 DIAGNOSIS — R232 Flushing: Secondary | ICD-10-CM

## 2012-06-18 DIAGNOSIS — M25531 Pain in right wrist: Secondary | ICD-10-CM

## 2012-06-18 DIAGNOSIS — N951 Menopausal and female climacteric states: Secondary | ICD-10-CM

## 2012-06-18 LAB — CBC WITH DIFFERENTIAL/PLATELET
Basophils Absolute: 0 10*3/uL (ref 0.0–0.1)
Eosinophils Absolute: 0.1 10*3/uL (ref 0.0–0.7)
HCT: 38.7 % (ref 36.0–46.0)
Hemoglobin: 12.9 g/dL (ref 12.0–15.0)
Lymphs Abs: 1 10*3/uL (ref 0.7–4.0)
MCHC: 33.3 g/dL (ref 30.0–36.0)
Neutro Abs: 4.7 10*3/uL (ref 1.4–7.7)
RDW: 13 % (ref 11.5–14.6)

## 2012-06-18 LAB — BASIC METABOLIC PANEL
CO2: 26 mEq/L (ref 19–32)
Glucose, Bld: 102 mg/dL — ABNORMAL HIGH (ref 70–99)
Potassium: 3.2 mEq/L — ABNORMAL LOW (ref 3.5–5.1)
Sodium: 136 mEq/L (ref 135–145)

## 2012-06-18 LAB — LIPID PANEL
Cholesterol: 282 mg/dL — ABNORMAL HIGH (ref 0–200)
HDL: 44.8 mg/dL (ref >39.00)
VLDL: 26.2 mg/dL (ref 0.0–40.0)

## 2012-06-18 LAB — HEPATIC FUNCTION PANEL
AST: 29 U/L (ref 0–37)
Albumin: 3.9 g/dL (ref 3.5–5.2)

## 2012-06-18 LAB — VITAMIN B12: Vitamin B-12: 264 pg/mL (ref 211–911)

## 2012-06-18 LAB — LDL CHOLESTEROL, DIRECT: Direct LDL: 205 mg/dL

## 2012-06-18 MED ORDER — POTASSIUM CHLORIDE CRYS ER 20 MEQ PO TBCR: 20.0000 meq | EXTENDED_RELEASE_TABLET | Freq: Every day | ORAL | Status: DC

## 2012-06-18 NOTE — Assessment & Plan Note (Signed)
Check thyroid  con't synthroid  

## 2012-06-18 NOTE — Assessment & Plan Note (Signed)
Pt would like to hold off on hand surgeon until thyroid back

## 2012-06-18 NOTE — Progress Notes (Signed)
  Subjective:    Patient ID: Tracy Mathews, female    DOB: 1965-11-03, 47 y.o.   MRN: [id number]  HPI Pt here for labs and f/u thyroid but c/o trigger finger in R hand.  She was told in the past it could be her thyroid.  She c/o hot flashes to and her periods are normal.  No other complaints.   Review of Systems As above    Objective:   Physical Exam BP 130/92  Pulse 79  Temp 98.1 F (36.7 C) (Oral)  Wt 200 lb 6.4 oz (90.901 kg)  SpO2 97% General appearance: alert, cooperative, appears stated age and no distress Throat: lips, mucosa, and tongue normal; teeth and gums normal Neck: no adenopathy, supple, symmetrical, trachea midline and thyroid not enlarged, symmetric, no tenderness/mass/nodules Lungs: clear to auscultation bilaterally Heart: S1, S2 normal       Assessment & Plan:

## 2012-06-18 NOTE — Assessment & Plan Note (Signed)
Splint given to patient-- will refer to hand surgeon prn

## 2012-06-18 NOTE — Patient Instructions (Addendum)

## 2012-06-19 ENCOUNTER — Other Ambulatory Visit: Payer: Self-pay | Admitting: Family Medicine

## 2012-07-06 ENCOUNTER — Other Ambulatory Visit: Payer: Self-pay

## 2012-07-12 ENCOUNTER — Other Ambulatory Visit: Payer: Self-pay | Admitting: Family Medicine

## 2012-07-12 ENCOUNTER — Telehealth: Payer: Self-pay | Admitting: *Deleted

## 2012-07-12 DIAGNOSIS — M6530 Trigger finger, unspecified finger: Secondary | ICD-10-CM

## 2012-07-12 NOTE — Telephone Encounter (Signed)
Ortho referral put in

## 2012-07-12 NOTE — Telephone Encounter (Signed)
Pt called to report that her hand is still no better and would like referral to hand specialist.Please advise

## 2012-07-15 ENCOUNTER — Encounter: Payer: Self-pay | Admitting: Family Medicine

## 2012-07-29 ENCOUNTER — Other Ambulatory Visit: Payer: Self-pay | Admitting: Family Medicine

## 2012-10-18 HISTORY — PX: TRIGGER FINGER RELEASE: SHX641

## 2013-01-10 ENCOUNTER — Ambulatory Visit (INDEPENDENT_AMBULATORY_CARE_PROVIDER_SITE_OTHER): Payer: BC Managed Care – PPO | Admitting: Family Medicine

## 2013-01-10 ENCOUNTER — Encounter: Payer: Self-pay | Admitting: Family Medicine

## 2013-01-10 VITALS — BP 114/80 | HR 72 | Temp 98.5°F | Ht 63.5 in | Wt 207.0 lb

## 2013-01-10 DIAGNOSIS — R609 Edema, unspecified: Secondary | ICD-10-CM

## 2013-01-10 DIAGNOSIS — R7309 Other abnormal glucose: Secondary | ICD-10-CM

## 2013-01-10 DIAGNOSIS — E669 Obesity, unspecified: Secondary | ICD-10-CM | POA: Insufficient documentation

## 2013-01-10 DIAGNOSIS — M79609 Pain in unspecified limb: Secondary | ICD-10-CM

## 2013-01-10 DIAGNOSIS — Z0000 Encounter for general adult medical examination without abnormal findings: Secondary | ICD-10-CM

## 2013-01-10 DIAGNOSIS — R739 Hyperglycemia, unspecified: Secondary | ICD-10-CM

## 2013-01-10 DIAGNOSIS — M79672 Pain in left foot: Secondary | ICD-10-CM | POA: Insufficient documentation

## 2013-01-10 DIAGNOSIS — E039 Hypothyroidism, unspecified: Secondary | ICD-10-CM

## 2013-01-10 DIAGNOSIS — E785 Hyperlipidemia, unspecified: Secondary | ICD-10-CM

## 2013-01-10 LAB — POCT URINALYSIS DIPSTICK
Blood, UA: NEGATIVE
Glucose, UA: NEGATIVE
Nitrite, UA: NEGATIVE
Urobilinogen, UA: 0.2
pH, UA: 5

## 2013-01-10 LAB — CBC WITH DIFFERENTIAL/PLATELET
Basophils Relative: 0.2 % (ref 0.0–3.0)
Eosinophils Relative: 2.5 % (ref 0.0–5.0)
Hemoglobin: 12.9 g/dL (ref 12.0–15.0)
Lymphocytes Relative: 23.1 % (ref 12.0–46.0)
MCV: 85.9 fl (ref 78.0–100.0)
Monocytes Absolute: 0.4 10*3/uL (ref 0.1–1.0)
Neutro Abs: 4.3 10*3/uL (ref 1.4–7.7)
Neutrophils Relative %: 67.7 % (ref 43.0–77.0)
RBC: 4.52 Mil/uL (ref 3.87–5.11)
WBC: 6.4 10*3/uL (ref 4.5–10.5)

## 2013-01-10 LAB — BASIC METABOLIC PANEL
Chloride: 104 mEq/L (ref 96–112)
Creatinine, Ser: 0.9 mg/dL (ref 0.4–1.2)
Sodium: 137 mEq/L (ref 135–145)

## 2013-01-10 LAB — HEPATIC FUNCTION PANEL
ALT: 25 U/L (ref 0–35)
Alkaline Phosphatase: 74 U/L (ref 39–117)
Bilirubin, Direct: 0 mg/dL (ref 0.0–0.3)
Total Protein: 7.5 g/dL (ref 6.0–8.3)

## 2013-01-10 MED ORDER — HYDROCHLOROTHIAZIDE 25 MG PO TABS: ORAL_TABLET | ORAL | Status: DC

## 2013-01-10 NOTE — Assessment & Plan Note (Signed)
Check labs 

## 2013-01-10 NOTE — Assessment & Plan Note (Signed)
Refill meds

## 2013-01-10 NOTE — Progress Notes (Signed)
Subjective:     Tracy Mathews is a 47 y.o. female and is here for a comprehensive physical exam. The patient reports problems - pain L foot x months.  History   Social History  . Marital Status: Single    Spouse Name: N/A    Number of Children: N/A  . Years of Education: N/A   Occupational History  . manager [employer]    Social History Main Topics  . Smoking status: Never Smoker   . Smokeless tobacco: Never Used  . Alcohol Use: Yes     Comment: occasionally  . Drug Use: No  . Sexual Activity: Yes    Partners: Male   Other Topics Concern  . Not on file   Social History Narrative   Exercising--no   Health Maintenance  Topic Date Due  . Mammogram  12/14/2012  . Influenza Vaccine  01/20/2013  . Pap Smear  11/07/2014  . Tetanus/tdap  11/06/2021    The following portions of the patient's history were reviewed and updated as appropriate:  She  has a past medical history of Hypothyroidism; Hives; Allergy; Hemorrhoid; Arthritis; and Hyperlipidemia. She  does not have any pertinent problems on file. She  has past surgical history that includes none and Trigger finger release (Right, 10/18/2012). Her family history includes Arthritis in her father and mother; Diabetes in her brother and mother; Heart attack in her mother; Heart disease (age of onset: 32) in her mother; Hyperlipidemia in her sister; Hypertension in her brother, brother, mother, and sister; Stroke in her father and mother. She  reports that she has never smoked. She has never used smokeless tobacco. She reports that  drinks alcohol. She reports that she does not use illicit drugs. She has a current medication list which includes the following prescription(s): cetirizine, glucosamine-chondroitin, hydrochlorothiazide, levothyroxine, omeprazole, potassium chloride sa, and tramadol. Current Outpatient Prescriptions on File Prior to Visit  Medication Sig Dispense Refill  . cetirizine (ZYRTEC) 10 MG tablet Take  10 mg by mouth daily.        Marland Kitchen glucosamine-chondroitin 500-400 MG tablet Take 1 tablet by mouth daily.      Marland Kitchen levothyroxine (SYNTHROID, LEVOTHROID) 112 MCG tablet TAKE 1 TABLET BY MOUTH EVERY DAY  30 tablet  11  . omeprazole (PRILOSEC OTC) 20 MG tablet Take 20 mg by mouth daily.      . potassium chloride SA (K-DUR,KLOR-CON) 20 MEQ tablet Take 1 tablet (20 mEq total) by mouth daily.  30 tablet  11  . traMADol (ULTRAM) 50 MG tablet Take 1 tablet (50 mg total) by mouth every 6 (six) hours as needed for pain.  30 tablet  0   No current facility-administered medications on file prior to visit.   She is allergic to simvastatin..  Review of Systems Review of Systems  Constitutional: Negative for activity change, appetite change and fatigue.  HENT: Negative for hearing loss, congestion, tinnitus and ear discharge.  dentist q46m Eyes: Negative for visual disturbance (see optho q1y -- vision corrected to 20/20 with glasses).  Respiratory: Negative for cough, chest tightness and shortness of breath.   Cardiovascular: Negative for chest pain, palpitations and leg swelling.  Gastrointestinal: Negative for abdominal pain, diarrhea, constipation and abdominal distention.  Genitourinary: Negative for urgency, frequency, decreased urine volume and difficulty urinating.  Musculoskeletal: Negative for back pain, arthralgias and gait problem.  Skin: Negative for color change, pallor and rash.  Neurological: Negative for dizziness, light-headedness, numbness and headaches.  Hematological: Negative for  adenopathy. Does not bruise/bleed easily.  Psychiatric/Behavioral: Negative for suicidal ideas, confusion, sleep disturbance, self-injury, dysphoric mood, decreased concentration and agitation.       Objective:    BP 114/80  Pulse 72  Temp(Src) 98.5 F (36.9 C) (Oral)  Ht 5' 3.5" (1.613 m)  Wt 207 lb (93.895 kg)  BMI 36.09 kg/m2  SpO2 98% General appearance: alert, cooperative, appears stated age and  no distress Head: Normocephalic, without obvious abnormality, atraumatic Eyes: conjunctivae/corneas clear. PERRL, EOM's intact. Fundi benign. Ears: normal TM's and external ear canals both ears Nose: Nares normal. Septum midline. Mucosa normal. No drainage or sinus tenderness. Throat: lips, mucosa, and tongue normal; teeth and gums normal Neck: no adenopathy, no carotid bruit, no JVD, supple, symmetrical, trachea midline and thyroid not enlarged, symmetric, no tenderness/mass/nodules Back: symmetric, no curvature. ROM normal. No CVA tenderness. Lungs: clear to auscultation bilaterally Breasts: normal appearance, no masses or tenderness Heart: regular rate and rhythm, S1, S2 normal, no murmur, click, rub or gallop Abdomen: soft, non-tender; bowel sounds normal; no masses,  no organomegaly Pelvic: deferred Extremities: extremities normal, atraumatic, no cyanosis or edema Pulses: 2+ and symmetric Skin: Skin color, texture, turgor normal. No rashes or lesions Lymph nodes: Cervical, supraclavicular, and axillary nodes normal. Neurologic: Alert and oriented X 3, normal strength and tone. Normal symmetric reflexes. Normal coordination and gait Psych--no anxiety, no depression      Assessment:    Healthy female exam.      Plan:    ghm utd Check fastiing labs See After Visit Summary for Counseling Recommendations

## 2013-01-10 NOTE — Assessment & Plan Note (Signed)
Check labs con't meds 

## 2013-01-10 NOTE — Patient Instructions (Addendum)
Preventive Care for Adults, Female A healthy lifestyle and preventive care can promote health and wellness. Preventive health guidelines for women include the following key practices.  A routine yearly physical is a good way to check with your caregiver about your health and preventive screening. It is a chance to share any concerns and updates on your health, and to receive a thorough exam.  Visit your dentist for a routine exam and preventive care every 6 months. Brush your teeth twice a day and floss once a day. Good oral hygiene prevents tooth decay and gum disease.  The frequency of eye exams is based on your age, health, family medical history, use of contact lenses, and other factors. Follow your caregiver's recommendations for frequency of eye exams.  Eat a healthy diet. Foods like vegetables, fruits, whole grains, low-fat dairy products, and lean protein foods contain the nutrients you need without too many calories. Decrease your intake of foods high in solid fats, added sugars, and salt. Eat the right amount of calories for you.Get information about a proper diet from your caregiver, if necessary.  Regular physical exercise is one of the most important things you can do for your health. Most adults should get at least 150 minutes of moderate-intensity exercise (any activity that increases your heart rate and causes you to sweat) each week. In addition, most adults need muscle-strengthening exercises on 2 or more days a week.  Maintain a healthy weight. The body mass index (BMI) is a screening tool to identify possible weight problems. It provides an estimate of body fat based on height and weight. Your caregiver can help determine your BMI, and can help you achieve or maintain a healthy weight.For adults 20 years and older:  A BMI below 18.5 is considered underweight.  A BMI of 18.5 to 24.9 is normal.  A BMI of 25 to 29.9 is considered overweight.  A BMI of 30 and above is  considered obese.  Maintain normal blood lipids and cholesterol levels by exercising and minimizing your intake of saturated fat. Eat a balanced diet with plenty of fruit and vegetables. Blood tests for lipids and cholesterol should begin at age 20 and be repeated every 5 years. If your lipid or cholesterol levels are high, you are over 50, or you are at high risk for heart disease, you may need your cholesterol levels checked more frequently.Ongoing high lipid and cholesterol levels should be treated with medicines if diet and exercise are not effective.  If you smoke, find out from your caregiver how to quit. If you do not use tobacco, do not start.  If you are pregnant, do not drink alcohol. If you are breastfeeding, be very cautious about drinking alcohol. If you are not pregnant and choose to drink alcohol, do not exceed 1 drink per day. One drink is considered to be 12 ounces (355 mL) of beer, 5 ounces (148 mL) of wine, or 1.5 ounces (44 mL) of liquor.  Avoid use of street drugs. Do not share needles with anyone. Ask for help if you need support or instructions about stopping the use of drugs.  High blood pressure causes heart disease and increases the risk of stroke. Your blood pressure should be checked at least every 1 to 2 years. Ongoing high blood pressure should be treated with medicines if weight loss and exercise are not effective.  If you are 55 to 49 years old, ask your caregiver if you should take aspirin to prevent strokes.  Diabetes   screening involves taking a blood sample to check your fasting blood sugar level. This should be done once every 3 years, after age 45, if you are within normal weight and without risk factors for diabetes. Testing should be considered at a younger age or be carried out more frequently if you are overweight and have at least 1 risk factor for diabetes.  Breast cancer screening is essential preventive care for women. You should practice "breast  self-awareness." This means understanding the normal appearance and feel of your breasts and may include breast self-examination. Any changes detected, no matter how small, should be reported to a caregiver. Women in their 20s and 30s should have a clinical breast exam (CBE) by a caregiver as part of a regular health exam every 1 to 3 years. After age 40, women should have a CBE every year. Starting at age 40, women should consider having a mammography (breast X-ray test) every year. Women who have a family history of breast cancer should talk to their caregiver about genetic screening. Women at a high risk of breast cancer should talk to their caregivers about having magnetic resonance imaging (MRI) and a mammography every year.  The Pap test is a screening test for cervical cancer. A Pap test can show cell changes on the cervix that might become cervical cancer if left untreated. A Pap test is a procedure in which cells are obtained and examined from the lower end of the uterus (cervix).  Women should have a Pap test starting at age 21.  Between ages 21 and 29, Pap tests should be repeated every 2 years.  Beginning at age 30, you should have a Pap test every 3 years as long as the past 3 Pap tests have been normal.  Some women have medical problems that increase the chance of getting cervical cancer. Talk to your caregiver about these problems. It is especially important to talk to your caregiver if a new problem develops soon after your last Pap test. In these cases, your caregiver may recommend more frequent screening and Pap tests.  The above recommendations are the same for women who have or have not gotten the vaccine for human papillomavirus (HPV).  If you had a hysterectomy for a problem that was not cancer or a condition that could lead to cancer, then you no longer need Pap tests. Even if you no longer need a Pap test, a regular exam is a good idea to make sure no other problems are  starting.  If you are between ages 65 and 70, and you have had normal Pap tests going back 10 years, you no longer need Pap tests. Even if you no longer need a Pap test, a regular exam is a good idea to make sure no other problems are starting.  If you have had past treatment for cervical cancer or a condition that could lead to cancer, you need Pap tests and screening for cancer for at least 20 years after your treatment.  If Pap tests have been discontinued, risk factors (such as a new sexual partner) need to be reassessed to determine if screening should be resumed.  The HPV test is an additional test that may be used for cervical cancer screening. The HPV test looks for the virus that can cause the cell changes on the cervix. The cells collected during the Pap test can be tested for HPV. The HPV test could be used to screen women aged 30 years and older, and should   be used in women of any age who have unclear Pap test results. After the age of 30, women should have HPV testing at the same frequency as a Pap test.  Colorectal cancer can be detected and often prevented. Most routine colorectal cancer screening begins at the age of 50 and continues through age 75. However, your caregiver may recommend screening at an earlier age if you have risk factors for colon cancer. On a yearly basis, your caregiver may provide home test kits to check for hidden blood in the stool. Use of a small camera at the end of a tube, to directly examine the colon (sigmoidoscopy or colonoscopy), can detect the earliest forms of colorectal cancer. Talk to your caregiver about this at age 50, when routine screening begins. Direct examination of the colon should be repeated every 5 to 10 years through age 75, unless early forms of pre-cancerous polyps or small growths are found.  Hepatitis C blood testing is recommended for all people born from 1945 through 1965 and any individual with known risks for hepatitis C.  Practice  safe sex. Use condoms and avoid high-risk sexual practices to reduce the spread of sexually transmitted infections (STIs). STIs include gonorrhea, chlamydia, syphilis, trichomonas, herpes, HPV, and human immunodeficiency virus (HIV). Herpes, HIV, and HPV are viral illnesses that have no cure. They can result in disability, cancer, and death. Sexually active women aged 25 and younger should be checked for chlamydia. Older women with new or multiple partners should also be tested for chlamydia. Testing for other STIs is recommended if you are sexually active and at increased risk.  Osteoporosis is a disease in which the bones lose minerals and strength with aging. This can result in serious bone fractures. The risk of osteoporosis can be identified using a bone density scan. Women ages 65 and over and women at risk for fractures or osteoporosis should discuss screening with their caregivers. Ask your caregiver whether you should take a calcium supplement or vitamin D to reduce the rate of osteoporosis.  Menopause can be associated with physical symptoms and risks. Hormone replacement therapy is available to decrease symptoms and risks. You should talk to your caregiver about whether hormone replacement therapy is right for you.  Use sunscreen with sun protection factor (SPF) of 30 or more. Apply sunscreen liberally and repeatedly throughout the day. You should seek shade when your shadow is shorter than you. Protect yourself by wearing long sleeves, pants, a wide-brimmed hat, and sunglasses year round, whenever you are outdoors.  Once a month, do a whole body skin exam, using a mirror to look at the skin on your back. Notify your caregiver of new moles, moles that have irregular borders, moles that are larger than a pencil eraser, or moles that have changed in shape or color.  Stay current with required immunizations.  Influenza. You need a dose every fall (or winter). The composition of the flu vaccine  changes each year, so being vaccinated once is not enough.  Pneumococcal polysaccharide. You need 1 to 2 doses if you smoke cigarettes or if you have certain chronic medical conditions. You need 1 dose at age 65 (or older) if you have never been vaccinated.  Tetanus, diphtheria, pertussis (Tdap, Td). Get 1 dose of Tdap vaccine if you are younger than age 65, are over 65 and have contact with an infant, are a healthcare worker, are pregnant, or simply want to be protected from whooping cough. After that, you need a Td   booster dose every 10 years. Consult your caregiver if you have not had at least 3 tetanus and diphtheria-containing shots sometime in your life or have a deep or dirty wound.  HPV. You need this vaccine if you are a woman age 26 or younger. The vaccine is given in 3 doses over 6 months.  Measles, mumps, rubella (MMR). You need at least 1 dose of MMR if you were born in 1957 or later. You may also need a second dose.  Meningococcal. If you are age 19 to 21 and a first-year college student living in a residence hall, or have one of several medical conditions, you need to get vaccinated against meningococcal disease. You may also need additional booster doses.  Zoster (shingles). If you are age 60 or older, you should get this vaccine.  Varicella (chickenpox). If you have never had chickenpox or you were vaccinated but received only 1 dose, talk to your caregiver to find out if you need this vaccine.  Hepatitis A. You need this vaccine if you have a specific risk factor for hepatitis A virus infection or you simply wish to be protected from this disease. The vaccine is usually given as 2 doses, 6 to 18 months apart.  Hepatitis B. You need this vaccine if you have a specific risk factor for hepatitis B virus infection or you simply wish to be protected from this disease. The vaccine is given in 3 doses, usually over 6 months. Preventive Services / Frequency Ages 19 to 39  Blood  pressure check.** / Every 1 to 2 years.  Lipid and cholesterol check.** / Every 5 years beginning at age 20.  Clinical breast exam.** / Every 3 years for women in their 20s and 30s.  Pap test.** / Every 2 years from ages 21 through 29. Every 3 years starting at age 30 through age 65 or 70 with a history of 3 consecutive normal Pap tests.  HPV screening.** / Every 3 years from ages 30 through ages 65 to 70 with a history of 3 consecutive normal Pap tests.  Hepatitis C blood test.** / For any individual with known risks for hepatitis C.  Skin self-exam. / Monthly.  Influenza immunization.** / Every year.  Pneumococcal polysaccharide immunization.** / 1 to 2 doses if you smoke cigarettes or if you have certain chronic medical conditions.  Tetanus, diphtheria, pertussis (Tdap, Td) immunization. / A one-time dose of Tdap vaccine. After that, you need a Td booster dose every 10 years.  HPV immunization. / 3 doses over 6 months, if you are 26 and younger.  Measles, mumps, rubella (MMR) immunization. / You need at least 1 dose of MMR if you were born in 1957 or later. You may also need a second dose.  Meningococcal immunization. / 1 dose if you are age 19 to 21 and a first-year college student living in a residence hall, or have one of several medical conditions, you need to get vaccinated against meningococcal disease. You may also need additional booster doses.  Varicella immunization.** / Consult your caregiver.  Hepatitis A immunization.** / Consult your caregiver. 2 doses, 6 to 18 months apart.  Hepatitis B immunization.** / Consult your caregiver. 3 doses usually over 6 months. Ages 40 to 64  Blood pressure check.** / Every 1 to 2 years.  Lipid and cholesterol check.** / Every 5 years beginning at age 20.  Clinical breast exam.** / Every year after age 40.  Mammogram.** / Every year beginning at age 40   and continuing for as long as you are in good health. Consult with your  caregiver.  Pap test.** / Every 3 years starting at age 30 through age 65 or 70 with a history of 3 consecutive normal Pap tests.  HPV screening.** / Every 3 years from ages 30 through ages 65 to 70 with a history of 3 consecutive normal Pap tests.  Fecal occult blood test (FOBT) of stool. / Every year beginning at age 50 and continuing until age 75. You may not need to do this test if you get a colonoscopy every 10 years.  Flexible sigmoidoscopy or colonoscopy.** / Every 5 years for a flexible sigmoidoscopy or every 10 years for a colonoscopy beginning at age 50 and continuing until age 75.  Hepatitis C blood test.** / For all people born from 1945 through 1965 and any individual with known risks for hepatitis C.  Skin self-exam. / Monthly.  Influenza immunization.** / Every year.  Pneumococcal polysaccharide immunization.** / 1 to 2 doses if you smoke cigarettes or if you have certain chronic medical conditions.  Tetanus, diphtheria, pertussis (Tdap, Td) immunization.** / A one-time dose of Tdap vaccine. After that, you need a Td booster dose every 10 years.  Measles, mumps, rubella (MMR) immunization. / You need at least 1 dose of MMR if you were born in 1957 or later. You may also need a second dose.  Varicella immunization.** / Consult your caregiver.  Meningococcal immunization.** / Consult your caregiver.  Hepatitis A immunization.** / Consult your caregiver. 2 doses, 6 to 18 months apart.  Hepatitis B immunization.** / Consult your caregiver. 3 doses, usually over 6 months. Ages 65 and over  Blood pressure check.** / Every 1 to 2 years.  Lipid and cholesterol check.** / Every 5 years beginning at age 20.  Clinical breast exam.** / Every year after age 40.  Mammogram.** / Every year beginning at age 40 and continuing for as long as you are in good health. Consult with your caregiver.  Pap test.** / Every 3 years starting at age 30 through age 65 or 70 with a 3  consecutive normal Pap tests. Testing can be stopped between 65 and 70 with 3 consecutive normal Pap tests and no abnormal Pap or HPV tests in the past 10 years.  HPV screening.** / Every 3 years from ages 30 through ages 65 or 70 with a history of 3 consecutive normal Pap tests. Testing can be stopped between 65 and 70 with 3 consecutive normal Pap tests and no abnormal Pap or HPV tests in the past 10 years.  Fecal occult blood test (FOBT) of stool. / Every year beginning at age 50 and continuing until age 75. You may not need to do this test if you get a colonoscopy every 10 years.  Flexible sigmoidoscopy or colonoscopy.** / Every 5 years for a flexible sigmoidoscopy or every 10 years for a colonoscopy beginning at age 50 and continuing until age 75.  Hepatitis C blood test.** / For all people born from 1945 through 1965 and any individual with known risks for hepatitis C.  Osteoporosis screening.** / A one-time screening for women ages 65 and over and women at risk for fractures or osteoporosis.  Skin self-exam. / Monthly.  Influenza immunization.** / Every year.  Pneumococcal polysaccharide immunization.** / 1 dose at age 65 (or older) if you have never been vaccinated.  Tetanus, diphtheria, pertussis (Tdap, Td) immunization. / A one-time dose of Tdap vaccine if you are over   65 and have contact with an infant, are a healthcare worker, or simply want to be protected from whooping cough. After that, you need a Td booster dose every 10 years.  Varicella immunization.** / Consult your caregiver.  Meningococcal immunization.** / Consult your caregiver.  Hepatitis A immunization.** / Consult your caregiver. 2 doses, 6 to 18 months apart.  Hepatitis B immunization.** / Check with your caregiver. 3 doses, usually over 6 months. ** Family history and personal history of risk and conditions may change your caregiver's recommendations. Document Released: 07/04/2001 Document Revised: 07/31/2011  Document Reviewed: 10/03/2010 [employer] Patient Information 2014 [employer].  

## 2013-01-10 NOTE — Assessment & Plan Note (Signed)
Refer to podiatry

## 2013-01-15 ENCOUNTER — Other Ambulatory Visit: Payer: Self-pay | Admitting: Internal Medicine

## 2013-01-15 DIAGNOSIS — M546 Pain in thoracic spine: Secondary | ICD-10-CM

## 2013-01-15 MED ORDER — POTASSIUM CHLORIDE CRYS ER 20 MEQ PO TBCR: 40.0000 meq | EXTENDED_RELEASE_TABLET | Freq: Every day | ORAL | Status: DC

## 2013-01-15 MED ORDER — TRAMADOL HCL 50 MG PO TABS: 50.0000 mg | ORAL_TABLET | Freq: Four times a day (QID) | ORAL | Status: DC | PRN

## 2013-01-15 NOTE — Telephone Encounter (Signed)
Last refill:04-26-12 Last OV:01-10-13 UDS:Not done Please advise.//AB/CMA

## 2013-01-15 NOTE — Addendum Note (Signed)
Addended by: Verdie Shire on: 01/15/2013 11:31 AM   Modules accepted: Orders

## 2013-03-03 ENCOUNTER — Telehealth: Payer: Self-pay | Admitting: Family Medicine

## 2013-03-03 MED ORDER — POTASSIUM CHLORIDE CRYS ER 20 MEQ PO TBCR: 40.0000 meq | EXTENDED_RELEASE_TABLET | Freq: Every day | ORAL | Status: DC

## 2013-03-03 NOTE — Telephone Encounter (Signed)
Patient needs a new rx for her Potassium sent to her [facility] on file. She was told to increase her dosage but is now out at her pharmacy because new rx was not sent reflecting increased dosage. Please advise.

## 2013-03-27 ENCOUNTER — Other Ambulatory Visit: Payer: Self-pay

## 2013-05-30 ENCOUNTER — Ambulatory Visit (INDEPENDENT_AMBULATORY_CARE_PROVIDER_SITE_OTHER): Payer: BC Managed Care – PPO | Admitting: Family Medicine

## 2013-05-30 ENCOUNTER — Encounter: Payer: Self-pay | Admitting: Family Medicine

## 2013-05-30 VITALS — BP 116/80 | HR 109 | Temp 98.0°F | Wt 207.0 lb

## 2013-05-30 DIAGNOSIS — M6740 Ganglion, unspecified site: Secondary | ICD-10-CM

## 2013-05-30 DIAGNOSIS — M67432 Ganglion, left wrist: Secondary | ICD-10-CM

## 2013-05-30 DIAGNOSIS — K219 Gastro-esophageal reflux disease without esophagitis: Secondary | ICD-10-CM

## 2013-05-30 LAB — CBC WITH DIFFERENTIAL/PLATELET
Basophils Absolute: 0 10*3/uL (ref 0.0–0.1)
Basophils Relative: 0 % (ref 0–1)
Eosinophils Absolute: 0.2 10*3/uL (ref 0.0–0.7)
Eosinophils Relative: 2 % (ref 0–5)
HCT: 39.2 % (ref 36.0–46.0)
Hemoglobin: 13.4 g/dL (ref 12.0–15.0)
LYMPHS PCT: 25 % (ref 12–46)
Lymphs Abs: 1.8 10*3/uL (ref 0.7–4.0)
MCH: 28.6 pg (ref 26.0–34.0)
MCHC: 34.2 g/dL (ref 30.0–36.0)
MCV: 83.6 fL (ref 78.0–100.0)
MONOS PCT: 6 % (ref 3–12)
Monocytes Absolute: 0.4 10*3/uL (ref 0.1–1.0)
NEUTROS ABS: 4.9 10*3/uL (ref 1.7–7.7)
NEUTROS PCT: 67 % (ref 43–77)
Platelets: 253 10*3/uL (ref 150–400)
RBC: 4.69 MIL/uL (ref 3.87–5.11)
RDW: 13.7 % (ref 11.5–15.5)
WBC: 7.3 10*3/uL (ref 4.0–10.5)

## 2013-05-30 MED ORDER — ESOMEPRAZOLE MAGNESIUM 40 MG PO CPDR: 40.0000 mg | DELAYED_RELEASE_CAPSULE | Freq: Every day | ORAL | Status: DC

## 2013-05-30 NOTE — Patient Instructions (Signed)

## 2013-05-30 NOTE — Progress Notes (Signed)
Pre visit review using our clinic review tool, if applicable. No additional management support is needed unless otherwise documented below in the visit note. 

## 2013-05-30 NOTE — Progress Notes (Signed)
  Subjective:     Tracy Mathews is an 48 y.o. female who presents for evaluation of heartburn. This has been associated with belching and eructation, chest pain, heartburn, midespigastric pain and upper abdominal discomfort. She denies abdominal bloating, choking on food, cough, deep pressure at base of neck, difficulty swallowing, early satiety, fullness after meals, hematemesis, melena and nausea. Symptoms have been present for a few days. She denies dysphagia. She has not lost weight. She denies melena, hematochezia, hematemesis, and coffee ground emesis. Medical therapy in the past has included: proton pump inhibitors.  The following portions of the patient's history were reviewed and updated as appropriate: allergies, current medications, past family history, past medical history, past social history, past surgical history and problem list.  Review of Systems Pertinent items are noted in HPI.   Objective:     BP 116/80  Pulse 109  Temp(Src) 98 F (36.7 C) (Oral)  Wt 207 lb (93.895 kg)  SpO2 98% General appearance: alert, cooperative, appears stated age and no distress Throat: lips, mucosa, and tongue normal; teeth and gums normal Neck: no adenopathy, supple, symmetrical, trachea midline and thyroid not enlarged, symmetric, no tenderness/mass/nodules Lungs: clear to auscultation bilaterally Heart: S1, S2 normal Abdomen: soft, non-tender; bowel sounds normal; no masses,  no organomegaly   Assessment:    Gastroesophageal Reflux Disease,      Plan:    Nonpharmacologic treatments were discussed including: eating smaller meals, elevation of the head of bed at night, avoidance of caffeine, chocolate, nicotine and peppermint, and avoiding tight fitting clothing. Will start a trial of nexium . Follow up in a few days or sooner as needed.  Refer to GI if no better---  Check labs

## 2013-05-31 LAB — HEPATIC FUNCTION PANEL
ALT: 29 U/L (ref 0–35)
AST: 20 U/L (ref 0–37)
Albumin: 4.5 g/dL (ref 3.5–5.2)
Alkaline Phosphatase: 97 U/L (ref 39–117)
BILIRUBIN DIRECT: 0.1 mg/dL (ref 0.0–0.3)
Indirect Bilirubin: 0.3 mg/dL (ref 0.0–0.9)
Total Bilirubin: 0.4 mg/dL (ref 0.3–1.2)
Total Protein: 7.1 g/dL (ref 6.0–8.3)

## 2013-05-31 LAB — BASIC METABOLIC PANEL
BUN: 13 mg/dL (ref 6–23)
CHLORIDE: 100 meq/L (ref 96–112)
CO2: 27 mEq/L (ref 19–32)
CREATININE: 0.92 mg/dL (ref 0.50–1.10)
Calcium: 9.7 mg/dL (ref 8.4–10.5)
Glucose, Bld: 137 mg/dL — ABNORMAL HIGH (ref 70–99)
Potassium: 3.5 mEq/L (ref 3.5–5.3)
Sodium: 137 mEq/L (ref 135–145)

## 2013-06-02 LAB — H. PYLORI ANTIBODY, IGG: H Pylori IgG: 0.42 {ISR}

## 2013-06-16 ENCOUNTER — Other Ambulatory Visit: Payer: Self-pay | Admitting: Family Medicine

## 2013-09-04 ENCOUNTER — Other Ambulatory Visit: Payer: Self-pay | Admitting: Family Medicine

## 2013-09-05 NOTE — Telephone Encounter (Signed)
Rx sent to the pharmacy by e-script.//AB/CMA 

## 2013-09-27 ENCOUNTER — Other Ambulatory Visit: Payer: Self-pay | Admitting: Family Medicine

## 2013-10-14 ENCOUNTER — Other Ambulatory Visit: Payer: Self-pay | Admitting: Family Medicine

## 2013-10-15 NOTE — Telephone Encounter (Signed)
Rx sent to the pharmacy by e-script.//AB/CMA 

## 2013-11-09 ENCOUNTER — Other Ambulatory Visit: Payer: Self-pay | Admitting: Family Medicine

## 2013-11-11 ENCOUNTER — Telehealth: Payer: Self-pay | Admitting: Family Medicine

## 2013-11-11 ENCOUNTER — Ambulatory Visit: Payer: Self-pay | Admitting: Family Medicine

## 2013-11-11 ENCOUNTER — Telehealth: Payer: Self-pay | Admitting: *Deleted

## 2013-11-11 NOTE — Telephone Encounter (Signed)
Please make sure she sees someone.

## 2013-11-11 NOTE — Telephone Encounter (Signed)
Patient Information:  Caller Name: Jannelly  Phone: [phone]  Patient: Tracy Mathews, Tracy Mathews  Gender: Female  DOB: 05-Dec-1965  Age: 48 Years  PCP: Rosalita Chessman.  Pregnant: No  Office Follow Up:  Does the office need to follow up with this patient?: No  Instructions For The Office: N/A   Symptoms  Reason For Call & Symptoms: Chest discomfort onset 11/04/13 at work.  Chest pain went to both arms while carrying a couple of hats, brought tears to her eyes, and she dropped the hats; episode lasted estimated one minute.  She felt flushed.  She had another episode on 11/09/13 that brought tears to her eyes and she dropped the object; episode less than a minute, "felt weird for about an hour" then went away.  Third episode walking to trash can with cup/napkin in hand.  She had released the objects into the trash can.  Chest pain lasted estimated 30 seconds, less intense than previously.    Denies feeling clammy, but gets red. The chest pain went into both arms even when carrying object in one hand.  History of pinched nerve in neck.  Emergent symptoms ruled out.  See Today in Office per Chest Pain guideline due to Intermittent chest pains persist > 3 days.  Reviewed Health History In EMR: Yes  Reviewed Medications In EMR: Yes  Reviewed Allergies In EMR: Yes  Reviewed Surgeries / Procedures: Yes  Date of Onset of Symptoms: 11/04/2013 OB / GYN:  LMP: Unknown  Guideline(s) Used:  Chest Pain  Disposition Per Guideline:   See Today in Office  Reason For Disposition Reached:   Intermittent chest pains persist > 3 days  Advice Given:  Fleeting Chest Pain:  Fleeting chest pains that last only a few seconds and then go away are generally not serious. They may be from pinched muscles or nerves in your chest wall.  Expected Course:  These mild chest pains usually disappear within 3 days.  Call Back If:  Severe chest pain  Constant chest pain lasting longer than 5 minutes  Difficulty breathing  You  become worse.  Patient Will Follow Care Advice:  YES  Appointment Scheduled:  11/11/2013 14:15:00 Appointment Scheduled Provider:  Colin Benton

## 2013-11-11 NOTE — Telephone Encounter (Signed)
Pt called this morning requesting an appointment for chest pain.  Call was transferred to CAN. Pt was given an appointment at [facility], who then later called patient back and advised patient to go to the ER.  Pt called the office back again requesting an appointment with Dr. Etter Sjogren.  The scheduler transferred call to RN.  Pt gave the same report as listed below concerning her 3 intermittent episodes of chest pain.  She is currently not experiencing chest pain, but states whenever she has chest pain the pains are severe, ranging from a 7/10 to 10/10, and feels like cramping that radiates down both arms.  Pt was again advised to go to the ER.  Pt states that she was unsure of whether or not she would be able to go due to child care issue.  She is currently calling her husband to see if he can assist with the children.  Will follow up with patient.

## 2013-11-11 NOTE — Telephone Encounter (Signed)
Called to follow up with patient.  No answer.  Left message for call back.

## 2013-11-11 NOTE — Telephone Encounter (Signed)
I called the pt to get more information as to her symptoms. She stated her chest pain started one week ago, radiates down both of her arms, and at times it is so severe it brings tears to her eyes and also has nausea afterwards and she is not having these symptoms now.  I advised her per Dr Maudie Mercury to go to the ER immediately and not wait until this afternoon and she stated she is not having the symptoms now.  I advised her she should go to the ER anytime she has symptoms like this to be sure she is not having any heart problems or a stroke and the ER could treat her for this instead of being seen here and the appt for this afternoon was cancelled.

## 2013-11-12 NOTE — Telephone Encounter (Signed)
Called again to follow up with patient.  No answer. Left message for call back.

## 2013-11-17 ENCOUNTER — Encounter: Payer: Self-pay | Admitting: Family Medicine

## 2013-11-17 ENCOUNTER — Ambulatory Visit (INDEPENDENT_AMBULATORY_CARE_PROVIDER_SITE_OTHER): Payer: BC Managed Care – PPO | Admitting: Family Medicine

## 2013-11-17 VITALS — BP 120/74 | HR 64 | Temp 97.7°F | Wt 204.0 lb

## 2013-11-17 DIAGNOSIS — E785 Hyperlipidemia, unspecified: Secondary | ICD-10-CM

## 2013-11-17 DIAGNOSIS — Z8249 Family history of ischemic heart disease and other diseases of the circulatory system: Secondary | ICD-10-CM

## 2013-11-17 DIAGNOSIS — R0789 Other chest pain: Secondary | ICD-10-CM

## 2013-11-17 LAB — BASIC METABOLIC PANEL
BUN: 17 mg/dL (ref 6–23)
CALCIUM: 9.3 mg/dL (ref 8.4–10.5)
CHLORIDE: 101 meq/L (ref 96–112)
CO2: 27 meq/L (ref 19–32)
CREATININE: 0.8 mg/dL (ref 0.4–1.2)
GFR: 81.73 mL/min (ref >60.00)
GLUCOSE: 82 mg/dL (ref 70–99)
Potassium: 3.9 mEq/L (ref 3.5–5.1)
Sodium: 137 mEq/L (ref 135–145)

## 2013-11-17 LAB — HEPATIC FUNCTION PANEL
ALBUMIN: 4.4 g/dL (ref 3.5–5.2)
ALT: 25 U/L (ref 0–35)
AST: 22 U/L (ref 0–37)
Alkaline Phosphatase: 89 U/L (ref 39–117)
BILIRUBIN DIRECT: 0 mg/dL (ref 0.0–0.3)
Total Bilirubin: 0.6 mg/dL (ref 0.2–1.2)
Total Protein: 7.6 g/dL (ref 6.0–8.3)

## 2013-11-17 LAB — CBC WITH DIFFERENTIAL/PLATELET
BASOS PCT: 0.3 % (ref 0.0–3.0)
Basophils Absolute: 0 10*3/uL (ref 0.0–0.1)
EOS ABS: 0.1 10*3/uL (ref 0.0–0.7)
Eosinophils Relative: 2 % (ref 0.0–5.0)
HCT: 39.8 % (ref 36.0–46.0)
Hemoglobin: 13.1 g/dL (ref 12.0–15.0)
LYMPHS PCT: 23.6 % (ref 12.0–46.0)
Lymphs Abs: 1.5 10*3/uL (ref 0.7–4.0)
MCHC: 32.9 g/dL (ref 30.0–36.0)
MCV: 86.1 fl (ref 78.0–100.0)
MONO ABS: 0.4 10*3/uL (ref 0.1–1.0)
Monocytes Relative: 6.6 % (ref 3.0–12.0)
NEUTROS ABS: 4.2 10*3/uL (ref 1.4–7.7)
NEUTROS PCT: 67.5 % (ref 43.0–77.0)
Platelets: 233 10*3/uL (ref 150.0–400.0)
RBC: 4.62 Mil/uL (ref 3.87–5.11)
RDW: 13.5 % (ref 11.5–15.5)
WBC: 6.3 10*3/uL (ref 4.0–10.5)

## 2013-11-17 LAB — LIPID PANEL
Cholesterol: 287 mg/dL — ABNORMAL HIGH (ref 0–200)
HDL: 52.4 mg/dL (ref >39.00)
LDL Cholesterol: 210 mg/dL — ABNORMAL HIGH (ref 0–99)
NonHDL: 234.6
Total CHOL/HDL Ratio: 5
Triglycerides: 122 mg/dL (ref 0.0–149.0)
VLDL: 24.4 mg/dL (ref 0.0–40.0)

## 2013-11-17 LAB — TSH: TSH: 2.26 u[IU]/mL (ref 0.35–4.50)

## 2013-11-17 NOTE — Progress Notes (Signed)
Pre visit review using our clinic review tool, if applicable. No additional management support is needed unless otherwise documented below in the visit note. 

## 2013-11-17 NOTE — Progress Notes (Signed)
Subjective:    Tracy Mathews is a 48 y.o. female who presents for evaluation of chest pain. Onset was 5 days ago. Symptoms have improved since that time. The patient describes the pain as crushing, heaviness, pressure and radiates to the left arm and right arm. Patient rates pain as a 10/10 in intensity. Associated symptoms are: chest pain and dyspnea. Aggravating factors are: none. Alleviating factors are: none. Patient's cardiac risk factors are: dyslipidemia, family history of premature cardiovascular disease, obesity (BMI >= 30 kg/m2) and sedentary lifestyle. Patient's risk factors for DVT/PE: none. Previous cardiac testing: chest x-ray and electrocardiogram (ECG).  The following portions of the patient's history were reviewed and updated as appropriate:  She  has a past medical history of Hypothyroidism; Hives; Allergy; Hemorrhoid; Arthritis; and Hyperlipidemia. She  does not have any pertinent problems on file. She  has past surgical history that includes none and Trigger finger release (Right, 10/18/2012). Her family history includes Arthritis in her father and mother; Diabetes in her brother and mother; Heart attack in her mother; Heart disease in her maternal uncle, maternal uncle, and maternal uncle; Heart disease (age of onset: 34) in her mother; Hyperlipidemia in her sister; Hypertension in her brother, brother, mother, and sister; Pulmonary fibrosis in her maternal uncle; Stroke in her father and mother. She  reports that she has never smoked. She has never used smokeless tobacco. She reports that she drinks alcohol. She reports that she does not use illicit drugs. She has a current medication list which includes the following prescription(s): cetirizine, esomeprazole, glucosamine-chondroitin, hydrochlorothiazide, levothyroxine, omeprazole, potassium chloride sa, and tramadol. Current Outpatient Prescriptions on File Prior to Visit  Medication Sig Dispense Refill  . cetirizine (ZYRTEC) 10  MG tablet Take 10 mg by mouth daily.        Marland Kitchen esomeprazole (NEXIUM) 40 MG capsule TAKE 1 CAPSULE BY MOUTH DAILY  30 capsule  11  . glucosamine-chondroitin 500-400 MG tablet Take 1 tablet by mouth daily.      . hydrochlorothiazide (HYDRODIURIL) 25 MG tablet TAKE 1 TABLET BY MOUTH EVERY DAY AS NEEDED  90 tablet  0  . levothyroxine (SYNTHROID, LEVOTHROID) 112 MCG tablet TAKE 1 TABLET BY MOUTH EVERY DAY  30 tablet  1  . omeprazole (PRILOSEC OTC) 20 MG tablet Take 20 mg by mouth daily.      . potassium chloride SA (K-DUR,KLOR-CON) 20 MEQ tablet TAKE 2 TABLETS BY MOUTH EVERY DAY  60 tablet  5  . traMADol (ULTRAM) 50 MG tablet Take 1 tablet (50 mg total) by mouth every 6 (six) hours as needed for pain.  30 tablet  0   No current facility-administered medications on file prior to visit.   She is allergic to simvastatin..  Review of Systems Pertinent items are noted in HPI.    Objective:    BP 120/74  Pulse 64  Temp(Src) 97.7 F (36.5 C) (Oral)  Wt 204 lb (92.534 kg)  SpO2 98%  LMP 10/06/2013 General appearance: alert, cooperative, appears stated age and no distress Neck: no adenopathy, no carotid bruit, no JVD, supple, symmetrical, trachea midline and thyroid not enlarged, symmetric, no tenderness/mass/nodules Lungs: clear to auscultation bilaterally Heart: regular rate and rhythm, S1, S2 normal, no murmur, click, rub or gallop Abdomen: soft, non-tender; bowel sounds normal; no masses,  no organomegaly Extremities: extremities normal, atraumatic, no cyanosis or edema  Cardiographics ECG: normal sinus rhythm, no blocks or conduction defects, no ischemic changes  Imaging Chest x-ray: completed recently was normal --per  pt      Assessment:    Chest pain,    Plan:    Patient history and exam consistent with non-cardiac cause of chest pain. OTC analgesics as needed. Worsening signs and symptoms discussed and patient verbalized understanding. Cardiology consultation. ER records  needed

## 2013-11-17 NOTE — Patient Instructions (Signed)

## 2013-11-18 LAB — POCT URINALYSIS DIPSTICK
BILIRUBIN UA: NEGATIVE
GLUCOSE UA: NEGATIVE
Ketones, UA: NEGATIVE
Leukocytes, UA: NEGATIVE
Nitrite, UA: NEGATIVE
Protein, UA: NEGATIVE
RBC UA: NEGATIVE
Urobilinogen, UA: 0.2
pH, UA: 6

## 2013-11-19 ENCOUNTER — Other Ambulatory Visit: Payer: Self-pay

## 2013-11-19 MED ORDER — SIMVASTATIN 20 MG PO TABS: 20.0000 mg | ORAL_TABLET | Freq: Every day | ORAL | Status: DC

## 2013-11-26 ENCOUNTER — Ambulatory Visit (HOSPITAL_BASED_OUTPATIENT_CLINIC_OR_DEPARTMENT_OTHER)
Admission: RE | Admit: 2013-11-26 | Discharge: 2013-11-26 | Disposition: A | Discharge status: Home / Self Care | Payer: BC Managed Care – PPO | Source: Ambulatory Visit | Attending: Family Medicine | Admitting: Family Medicine

## 2013-11-26 DIAGNOSIS — I369 Nonrheumatic tricuspid valve disorder, unspecified: Secondary | ICD-10-CM

## 2013-11-26 DIAGNOSIS — R0789 Other chest pain: Secondary | ICD-10-CM

## 2013-11-26 NOTE — Progress Notes (Signed)
  Echocardiogram 2D Echocardiogram has been performed.  Donata Clay 11/26/2013, 11:46 AM

## 2013-11-29 ENCOUNTER — Other Ambulatory Visit: Payer: Self-pay | Admitting: Family Medicine

## 2013-11-29 DIAGNOSIS — M546 Pain in thoracic spine: Secondary | ICD-10-CM

## 2013-12-01 MED ORDER — TRAMADOL HCL 50 MG PO TABS: 50.0000 mg | ORAL_TABLET | Freq: Four times a day (QID) | ORAL | Status: AC | PRN

## 2013-12-01 MED ORDER — HYDROCHLOROTHIAZIDE 25 MG PO TABS: ORAL_TABLET | ORAL | Status: DC

## 2013-12-01 NOTE — Telephone Encounter (Signed)
Last seen 11/17/13 and filled 01/15/13 #30.  No UDS or contract.  Please advise     KP

## 2013-12-03 ENCOUNTER — Telehealth: Payer: Self-pay | Admitting: Family Medicine

## 2013-12-03 NOTE — Telephone Encounter (Signed)
Caller name: Asiya Cutbirth Relation to pt: patient Call back number: [phone] Pharmacy:  Reason for call: Patient called stating that in the past when taking simvastatin drugs she experiences leg cramps and pain. Patient wanted to make sure that it was okay to take simvastatin (ZOCOR) 20 MG tablet. Please advise.

## 2013-12-04 MED ORDER — PITAVASTATIN CALCIUM 2 MG PO TABS: 1.0000 | ORAL_TABLET | Freq: Every day | ORAL | Status: DC

## 2013-12-04 NOTE — Telephone Encounter (Signed)
Try livalo 2 mg #30  1 po qd ,  2 refills-- call if leg cramps occur Also take otc coQ 10 200 mg daily

## 2013-12-04 NOTE — Telephone Encounter (Signed)
Please advise      KP 

## 2013-12-04 NOTE — Telephone Encounter (Signed)
Detailed message left at callback number with MD's recommendations. Return call requested if needed and Rx sent to pharmacy in chart.      KP

## 2013-12-16 ENCOUNTER — Ambulatory Visit (INDEPENDENT_AMBULATORY_CARE_PROVIDER_SITE_OTHER): Payer: BC Managed Care – PPO | Admitting: Interventional Cardiology

## 2013-12-16 ENCOUNTER — Encounter: Payer: Self-pay | Admitting: Interventional Cardiology

## 2013-12-16 VITALS — BP 110/80 | HR 68 | Ht 63.0 in | Wt 208.0 lb

## 2013-12-16 DIAGNOSIS — R0789 Other chest pain: Secondary | ICD-10-CM

## 2013-12-16 DIAGNOSIS — R609 Edema, unspecified: Secondary | ICD-10-CM

## 2013-12-16 DIAGNOSIS — R600 Localized edema: Secondary | ICD-10-CM

## 2013-12-16 NOTE — Progress Notes (Signed)
Patient ID: Tracy Mathews, female   DOB: Sep 16, 1965, 48 y.o.   MRN: [id number]   Date: 12/16/2013 ID: Tracy, Mathews [birth year], MRN [id number] PCP: Tracy Koyanagi, DO  Reason: Chest pain  ASSESSMENT;  1. Chest pain, described as crushing with radiation into the arms, lasting less than 10 minutes on 3 occasions in late June and early July. No recurrence since the last episode 2. History of lower extremity edema 3. Obesity 4. Family history of CAD  PLAN:  1. Exercise treadmill test   SUBJECTIVE: Tracy Mathews is a 48 y.o. female who is referred by Dr. Etter Mathews for evaluation of chest discomfort. There were 3 discrete episodes of chest discomfort that occurred over a 10-14 day time frame in late June/early July. These episodes were described as a severe precordial pressure with radiation into each arm. Each episode lasted less than 10 minutes. 2 episodes occurred while the patient was being active and not necessarily with heavy physical stress. Another episode occurred while on vacation the patient was at rest. The discomfort would have an abrupt onset and then gradually resolve over several minutes. Since the last episode she has been her usual self. There no new symptoms or limitations. She denies dyspnea. She does have a history of intermittent arm weakness. This predated these episodes of chest discomfort. She also notices some palpitations at night when she lies down, a sense of her heart racing they can go on up to 60 seconds before resolving. This particular complaint is not associated with activities and is never occurred while active. A recent echocardiogram is unremarkable with normal EF and structure.   Allergies  Allergen Reactions  . Simvastatin     REACTION: cramps    Current Outpatient Prescriptions on File Prior to Visit  Medication Sig Dispense Refill  . cetirizine (ZYRTEC) 10 MG tablet Take 10 mg by mouth daily.        Marland Kitchen esomeprazole (NEXIUM) 40 MG capsule TAKE 1 CAPSULE  BY MOUTH DAILY  30 capsule  11  . glucosamine-chondroitin 500-400 MG tablet Take 1 tablet by mouth daily.      . hydrochlorothiazide (HYDRODIURIL) 25 MG tablet TAKE 1 TABLET BY MOUTH EVERY DAY AS NEEDED  90 tablet  0  . levothyroxine (SYNTHROID, LEVOTHROID) 112 MCG tablet TAKE 1 TABLET BY MOUTH EVERY DAY  30 tablet  1  . omeprazole (PRILOSEC OTC) 20 MG tablet Take 20 mg by mouth daily.      . Pitavastatin Calcium 2 MG TABS Take 1 tablet (2 mg total) by mouth daily.  30 tablet  2  . potassium chloride SA (K-DUR,KLOR-CON) 20 MEQ tablet TAKE 2 TABLETS BY MOUTH EVERY DAY  60 tablet  5  . traMADol (ULTRAM) 50 MG tablet Take 1 tablet (50 mg total) by mouth every 6 (six) hours as needed.  30 tablet  0   No current facility-administered medications on file prior to visit.    Past Medical History  Diagnosis Date  . Hypothyroidism   . Hives     chronic  . Allergy   . Hemorrhoid   . Arthritis   . Hyperlipidemia     Past Surgical History  Procedure Laterality Date  . None    . Trigger finger release Right 10/18/2012    [facility]    History   Social History  . Marital Status: Single    Spouse Name: N/A    Number of Children: N/A  . Years of Education: N/A  Occupational History  . manager [employer]    Social History Main Topics  . Smoking status: Never Smoker   . Smokeless tobacco: Never Used  . Alcohol Use: Yes     Comment: occasionally  . Drug Use: No  . Sexual Activity: Yes    Partners: Male   Other Topics Concern  . Not on file   Social History Narrative   Exercising--no    Family History  Problem Relation Age of Onset  . Stroke Mother   . Heart attack Mother   . Hypertension Mother   . Diabetes Mother   . Heart disease Mother 39    chf, MI at 36 , stents  . Arthritis Mother     ? RA  . Stroke Father   . Arthritis Father     ?RA  . Hypertension Sister   . Hypertension Brother   . Diabetes Brother   . Hypertension Brother   . Hyperlipidemia Sister    . Heart disease Maternal Uncle   . Heart disease Maternal Uncle   . Heart disease Maternal Uncle   . Pulmonary fibrosis Maternal Uncle     ROS: No rash, joint discomfort, headache, history of stroke, syncope, lung disease, history of DVT, history of PE, or connective tissue disease.. Other systems negative for complaints.  OBJECTIVE: BP 110/80  Pulse 68  Ht 5\' 3"  (1.6 m)  Wt 208 lb (94.348 kg)  BMI 36.85 kg/m2  LMP 10/06/2013,  General: No acute distress, mildly obese. Skin color without evidence of pallor or jaundice HEENT: normal no jaundice or pallor Neck: JVD flat. Carotids 2+ upstroke without bruits Chest: Clear Cardiac: Murmur: None. Gallop: None. Rhythm: Normal. Other: None Abdomen: Bruit: Absent. Pulsation: Absent Extremities: Edema: None. Pulses: 2+ bilateral Neuro: Normal Psych: Normal  ECG: Done in June and was entirely normal

## 2013-12-16 NOTE — Patient Instructions (Signed)
Your physician recommends that you continue on your current medications as directed. Please refer to the Current Medication list given to you today.  Your physician has requested that you have an exercise tolerance test. For further information please visit [email or web address]. Please also follow instruction sheet, as given.   Your physician recommends that you schedule a follow-up appointment as needed

## 2014-01-05 ENCOUNTER — Telehealth: Payer: Self-pay | Admitting: Family Medicine

## 2014-01-05 NOTE — Telephone Encounter (Signed)
Pt needs to be seen because that is for a diagnostic mammogram and we have to see it to document where it is to order a diagnostic

## 2014-01-05 NOTE — Telephone Encounter (Signed)
Please advise      KP 

## 2014-01-05 NOTE — Telephone Encounter (Addendum)
Caller name: Tommie Relation to pt: Call back number:[phone]   Reason for call:  Pt wants a referral placed for mammogram @ [facility].  Pt felt a lump and wants this as soon as possible.

## 2014-01-05 NOTE — Telephone Encounter (Signed)
detailed message left advising of Dr.Lowne recommendations. Patient has been made aware to call the office back for an appointment.       KP

## 2014-01-13 ENCOUNTER — Ambulatory Visit (INDEPENDENT_AMBULATORY_CARE_PROVIDER_SITE_OTHER): Payer: BC Managed Care – PPO | Admitting: Family Medicine

## 2014-01-13 ENCOUNTER — Encounter: Payer: Self-pay | Admitting: Family Medicine

## 2014-01-13 VITALS — BP 116/80 | HR 66 | Temp 98.0°F | Ht 63.0 in | Wt 209.0 lb

## 2014-01-13 DIAGNOSIS — Z0000 Encounter for general adult medical examination without abnormal findings: Secondary | ICD-10-CM

## 2014-01-13 DIAGNOSIS — F411 Generalized anxiety disorder: Secondary | ICD-10-CM

## 2014-01-13 DIAGNOSIS — Z1231 Encounter for screening mammogram for malignant neoplasm of breast: Secondary | ICD-10-CM

## 2014-01-13 LAB — TSH: TSH: 2.63 u[IU]/mL (ref 0.35–4.50)

## 2014-01-13 LAB — BASIC METABOLIC PANEL
BUN: 19 mg/dL (ref 6–23)
CALCIUM: 9.1 mg/dL (ref 8.4–10.5)
CO2: 29 mEq/L (ref 19–32)
Chloride: 101 mEq/L (ref 96–112)
Creatinine, Ser: 0.9 mg/dL (ref 0.4–1.2)
GFR: 73.07 mL/min (ref >60.00)
Glucose, Bld: 88 mg/dL (ref 70–99)
POTASSIUM: 3.7 meq/L (ref 3.5–5.1)
SODIUM: 136 meq/L (ref 135–145)

## 2014-01-13 LAB — CBC WITH DIFFERENTIAL/PLATELET
Basophils Absolute: 0 10*3/uL (ref 0.0–0.1)
Basophils Relative: 0.4 % (ref 0.0–3.0)
EOS PCT: 2.6 % (ref 0.0–5.0)
Eosinophils Absolute: 0.2 10*3/uL (ref 0.0–0.7)
HCT: 38.7 % (ref 36.0–46.0)
HEMOGLOBIN: 12.6 g/dL (ref 12.0–15.0)
LYMPHS PCT: 22.8 % (ref 12.0–46.0)
Lymphs Abs: 1.4 10*3/uL (ref 0.7–4.0)
MCHC: 32.6 g/dL (ref 30.0–36.0)
MCV: 86.7 fl (ref 78.0–100.0)
MONOS PCT: 6.3 % (ref 3.0–12.0)
Monocytes Absolute: 0.4 10*3/uL (ref 0.1–1.0)
Neutro Abs: 4.3 10*3/uL (ref 1.4–7.7)
Neutrophils Relative %: 67.9 % (ref 43.0–77.0)
Platelets: 220 10*3/uL (ref 150.0–400.0)
RBC: 4.46 Mil/uL (ref 3.87–5.11)
RDW: 13.6 % (ref 11.5–15.5)
WBC: 6.3 10*3/uL (ref 4.0–10.5)

## 2014-01-13 LAB — HEPATIC FUNCTION PANEL
ALK PHOS: 109 U/L (ref 39–117)
ALT: 31 U/L (ref 0–35)
AST: 25 U/L (ref 0–37)
Albumin: 3.8 g/dL (ref 3.5–5.2)
BILIRUBIN TOTAL: 0.4 mg/dL (ref 0.2–1.2)
Bilirubin, Direct: 0 mg/dL (ref 0.0–0.3)
Total Protein: 7 g/dL (ref 6.0–8.3)

## 2014-01-13 LAB — LIPID PANEL
CHOLESTEROL: 197 mg/dL (ref 0–200)
HDL: 50.5 mg/dL (ref >39.00)
LDL CALC: 120 mg/dL — AB (ref 0–99)
NONHDL: 146.5
Total CHOL/HDL Ratio: 4
Triglycerides: 133 mg/dL (ref 0.0–149.0)
VLDL: 26.6 mg/dL (ref 0.0–40.0)

## 2014-01-13 MED ORDER — ALPRAZOLAM 0.25 MG PO TABS: 0.2500 mg | ORAL_TABLET | Freq: Three times a day (TID) | ORAL | Status: DC | PRN

## 2014-01-13 NOTE — Patient Instructions (Signed)
Preventive Care for Adults A healthy lifestyle and preventive care can promote health and wellness. Preventive health guidelines for women include the following key practices.  A routine yearly physical is a good way to check with your health care provider about your health and preventive screening. It is a chance to share any concerns and updates on your health and to receive a thorough exam.  Visit your dentist for a routine exam and preventive care every 6 months. Brush your teeth twice a day and floss once a day. Good oral hygiene prevents tooth decay and gum disease.  The frequency of eye exams is based on your age, health, family medical history, use of contact lenses, and other factors. Follow your health care provider's recommendations for frequency of eye exams.  Eat a healthy diet. Foods like vegetables, fruits, whole grains, low-fat dairy products, and lean protein foods contain the nutrients you need without too many calories. Decrease your intake of foods high in solid fats, added sugars, and salt. Eat the right amount of calories for you.Get information about a proper diet from your health care provider, if necessary.  Regular physical exercise is one of the most important things you can do for your health. Most adults should get at least 150 minutes of moderate-intensity exercise (any activity that increases your heart rate and causes you to sweat) each week. In addition, most adults need muscle-strengthening exercises on 2 or more days a week.  Maintain a healthy weight. The body mass index (BMI) is a screening tool to identify possible weight problems. It provides an estimate of body fat based on height and weight. Your health care provider can find your BMI and can help you achieve or maintain a healthy weight.For adults 20 years and older:  A BMI below 18.5 is considered underweight.  A BMI of 18.5 to 24.9 is normal.  A BMI of 25 to 29.9 is considered overweight.  A BMI of  30 and above is considered obese.  Maintain normal blood lipids and cholesterol levels by exercising and minimizing your intake of saturated fat. Eat a balanced diet with plenty of fruit and vegetables. Blood tests for lipids and cholesterol should begin at age 76 and be repeated every 5 years. If your lipid or cholesterol levels are high, you are over 50, or you are at high risk for heart disease, you may need your cholesterol levels checked more frequently.Ongoing high lipid and cholesterol levels should be treated with medicines if diet and exercise are not working.  If you smoke, find out from your health care provider how to quit. If you do not use tobacco, do not start.  Lung cancer screening is recommended for adults aged 22-80 years who are at high risk for developing lung cancer because of a history of smoking. A yearly low-dose CT scan of the lungs is recommended for people who have at least a 30-pack-year history of smoking and are a current smoker or have quit within the past 15 years. A pack year of smoking is smoking an average of 1 pack of cigarettes a day for 1 year (for example: 1 pack a day for 30 years or 2 packs a day for 15 years). Yearly screening should continue until the smoker has stopped smoking for at least 15 years. Yearly screening should be stopped for people who develop a health problem that would prevent them from having lung cancer treatment.  If you are pregnant, do not drink alcohol. If you are breastfeeding,  be very cautious about drinking alcohol. If you are not pregnant and choose to drink alcohol, do not have more than 1 drink per day. One drink is considered to be 12 ounces (355 mL) of beer, 5 ounces (148 mL) of wine, or 1.5 ounces (44 mL) of liquor.  Avoid use of street drugs. Do not share needles with anyone. Ask for help if you need support or instructions about stopping the use of drugs.  High blood pressure causes heart disease and increases the risk of  stroke. Your blood pressure should be checked at least every 1 to 2 years. Ongoing high blood pressure should be treated with medicines if weight loss and exercise do not work.  If you are 75-52 years old, ask your health care provider if you should take aspirin to prevent strokes.  Diabetes screening involves taking a blood sample to check your fasting blood sugar level. This should be done once every 3 years, after age 15, if you are within normal weight and without risk factors for diabetes. Testing should be considered at a younger age or be carried out more frequently if you are overweight and have at least 1 risk factor for diabetes.  Breast cancer screening is essential preventive care for women. You should practice "breast self-awareness." This means understanding the normal appearance and feel of your breasts and may include breast self-examination. Any changes detected, no matter how small, should be reported to a health care provider. Women in their 58s and 30s should have a clinical breast exam (CBE) by a health care provider as part of a regular health exam every 1 to 3 years. After age 16, women should have a CBE every year. Starting at age 53, women should consider having a mammogram (breast X-ray test) every year. Women who have a family history of breast cancer should talk to their health care provider about genetic screening. Women at a high risk of breast cancer should talk to their health care providers about having an MRI and a mammogram every year.  Breast cancer gene (BRCA)-related cancer risk assessment is recommended for women who have family members with BRCA-related cancers. BRCA-related cancers include breast, ovarian, tubal, and peritoneal cancers. Having family members with these cancers may be associated with an increased risk for harmful changes (mutations) in the breast cancer genes BRCA1 and BRCA2. Results of the assessment will determine the need for genetic counseling and  BRCA1 and BRCA2 testing.  Routine pelvic exams to screen for cancer are no longer recommended for nonpregnant women who are considered low risk for cancer of the pelvic organs (ovaries, uterus, and vagina) and who do not have symptoms. Ask your health care provider if a screening pelvic exam is right for you.  If you have had past treatment for cervical cancer or a condition that could lead to cancer, you need Pap tests and screening for cancer for at least 20 years after your treatment. If Pap tests have been discontinued, your risk factors (such as having a new sexual partner) need to be reassessed to determine if screening should be resumed. Some women have medical problems that increase the chance of getting cervical cancer. In these cases, your health care provider may recommend more frequent screening and Pap tests.  The HPV test is an additional test that may be used for cervical cancer screening. The HPV test looks for the virus that can cause the cell changes on the cervix. The cells collected during the Pap test can be  tested for HPV. The HPV test could be used to screen women aged 30 years and older, and should be used in women of any age who have unclear Pap test results. After the age of 30, women should have HPV testing at the same frequency as a Pap test.  Colorectal cancer can be detected and often prevented. Most routine colorectal cancer screening begins at the age of 50 years and continues through age 75 years. However, your health care provider may recommend screening at an earlier age if you have risk factors for colon cancer. On a yearly basis, your health care provider may provide home test kits to check for hidden blood in the stool. Use of a small camera at the end of a tube, to directly examine the colon (sigmoidoscopy or colonoscopy), can detect the earliest forms of colorectal cancer. Talk to your health care provider about this at age 50, when routine screening begins. Direct  exam of the colon should be repeated every 5-10 years through age 75 years, unless early forms of pre-cancerous polyps or small growths are found.  People who are at an increased risk for hepatitis B should be screened for this virus. You are considered at high risk for hepatitis B if:  You were born in a country where hepatitis B occurs often. Talk with your health care provider about which countries are considered high risk.  Your parents were born in a high-risk country and you have not received a shot to protect against hepatitis B (hepatitis B vaccine).  You have HIV or AIDS.  You use needles to inject street drugs.  You live with, or have sex with, someone who has hepatitis B.  You get hemodialysis treatment.  You take certain medicines for conditions like cancer, organ transplantation, and autoimmune conditions.  Hepatitis C blood testing is recommended for all people born from 1945 through 1965 and any individual with known risks for hepatitis C.  Practice safe sex. Use condoms and avoid high-risk sexual practices to reduce the spread of sexually transmitted infections (STIs). STIs include gonorrhea, chlamydia, syphilis, trichomonas, herpes, HPV, and human immunodeficiency virus (HIV). Herpes, HIV, and HPV are viral illnesses that have no cure. They can result in disability, cancer, and death.  You should be screened for sexually transmitted illnesses (STIs) including gonorrhea and chlamydia if:  You are sexually active and are younger than 24 years.  You are older than 24 years and your health care provider tells you that you are at risk for this type of infection.  Your sexual activity has changed since you were last screened and you are at an increased risk for chlamydia or gonorrhea. Ask your health care provider if you are at risk.  If you are at risk of being infected with HIV, it is recommended that you take a prescription medicine daily to prevent HIV infection. This is  called preexposure prophylaxis (PrEP). You are considered at risk if:  You are a heterosexual woman, are sexually active, and are at increased risk for HIV infection.  You take drugs by injection.  You are sexually active with a partner who has HIV.  Talk with your health care provider about whether you are at high risk of being infected with HIV. If you choose to begin PrEP, you should first be tested for HIV. You should then be tested every 3 months for as long as you are taking PrEP.  Osteoporosis is a disease in which the bones lose minerals and strength   with aging. This can result in serious bone fractures or breaks. The risk of osteoporosis can be identified using a bone density scan. Women ages 65 years and over and women at risk for fractures or osteoporosis should discuss screening with their health care providers. Ask your health care provider whether you should take a calcium supplement or vitamin D to reduce the rate of osteoporosis.  Menopause can be associated with physical symptoms and risks. Hormone replacement therapy is available to decrease symptoms and risks. You should talk to your health care provider about whether hormone replacement therapy is right for you.  Use sunscreen. Apply sunscreen liberally and repeatedly throughout the day. You should seek shade when your shadow is shorter than you. Protect yourself by wearing long sleeves, pants, a wide-brimmed hat, and sunglasses year round, whenever you are outdoors.  Once a month, do a whole body skin exam, using a mirror to look at the skin on your back. Tell your health care provider of new moles, moles that have irregular borders, moles that are larger than a pencil eraser, or moles that have changed in shape or color.  Stay current with required vaccines (immunizations).  Influenza vaccine. All adults should be immunized every year.  Tetanus, diphtheria, and acellular pertussis (Td, Tdap) vaccine. Pregnant women should  receive 1 dose of Tdap vaccine during each pregnancy. The dose should be obtained regardless of the length of time since the last dose. Immunization is preferred during the 27th-36th week of gestation. An adult who has not previously received Tdap or who does not know her vaccine status should receive 1 dose of Tdap. This initial dose should be followed by tetanus and diphtheria toxoids (Td) booster doses every 10 years. Adults with an unknown or incomplete history of completing a 3-dose immunization series with Td-containing vaccines should begin or complete a primary immunization series including a Tdap dose. Adults should receive a Td booster every 10 years.  Varicella vaccine. An adult without evidence of immunity to varicella should receive 2 doses or a second dose if she has previously received 1 dose. Pregnant females who do not have evidence of immunity should receive the first dose after pregnancy. This first dose should be obtained before leaving the health care facility. The second dose should be obtained 4-8 weeks after the first dose.  Human papillomavirus (HPV) vaccine. Females aged 13-26 years who have not received the vaccine previously should obtain the 3-dose series. The vaccine is not recommended for use in pregnant females. However, pregnancy testing is not needed before receiving a dose. If a female is found to be pregnant after receiving a dose, no treatment is needed. In that case, the remaining doses should be delayed until after the pregnancy. Immunization is recommended for any person with an immunocompromised condition through the age of 26 years if she did not get any or all doses earlier. During the 3-dose series, the second dose should be obtained 4-8 weeks after the first dose. The third dose should be obtained 24 weeks after the first dose and 16 weeks after the second dose.  Zoster vaccine. One dose is recommended for adults aged 60 years or older unless certain conditions are  present.  Measles, mumps, and rubella (MMR) vaccine. Adults born before 1957 generally are considered immune to measles and mumps. Adults born in 1957 or later should have 1 or more doses of MMR vaccine unless there is a contraindication to the vaccine or there is laboratory evidence of immunity to   each of the three diseases. A routine second dose of MMR vaccine should be obtained at least 28 days after the first dose for students attending postsecondary schools, health care workers, or international travelers. People who received inactivated measles vaccine or an unknown type of measles vaccine during 1963-1967 should receive 2 doses of MMR vaccine. People who received inactivated mumps vaccine or an unknown type of mumps vaccine before 1979 and are at high risk for mumps infection should consider immunization with 2 doses of MMR vaccine. For females of childbearing age, rubella immunity should be determined. If there is no evidence of immunity, females who are not pregnant should be vaccinated. If there is no evidence of immunity, females who are pregnant should delay immunization until after pregnancy. Unvaccinated health care workers born before 1957 who lack laboratory evidence of measles, mumps, or rubella immunity or laboratory confirmation of disease should consider measles and mumps immunization with 2 doses of MMR vaccine or rubella immunization with 1 dose of MMR vaccine.  Pneumococcal 13-valent conjugate (PCV13) vaccine. When indicated, a person who is uncertain of her immunization history and has no record of immunization should receive the PCV13 vaccine. An adult aged 19 years or older who has certain medical conditions and has not been previously immunized should receive 1 dose of PCV13 vaccine. This PCV13 should be followed with a dose of pneumococcal polysaccharide (PPSV23) vaccine. The PPSV23 vaccine dose should be obtained at least 8 weeks after the dose of PCV13 vaccine. An adult aged 19  years or older who has certain medical conditions and previously received 1 or more doses of PPSV23 vaccine should receive 1 dose of PCV13. The PCV13 vaccine dose should be obtained 1 or more years after the last PPSV23 vaccine dose.  Pneumococcal polysaccharide (PPSV23) vaccine. When PCV13 is also indicated, PCV13 should be obtained first. All adults aged 65 years and older should be immunized. An adult younger than age 65 years who has certain medical conditions should be immunized. Any person who resides in a nursing home or long-term care facility should be immunized. An adult smoker should be immunized. People with an immunocompromised condition and certain other conditions should receive both PCV13 and PPSV23 vaccines. People with human immunodeficiency virus (HIV) infection should be immunized as soon as possible after diagnosis. Immunization during chemotherapy or radiation therapy should be avoided. Routine use of PPSV23 vaccine is not recommended for American Indians, [place] Natives, or people younger than 65 years unless there are medical conditions that require PPSV23 vaccine. When indicated, people who have unknown immunization and have no record of immunization should receive PPSV23 vaccine. One-time revaccination 5 years after the first dose of PPSV23 is recommended for people aged 19-64 years who have chronic kidney failure, nephrotic syndrome, asplenia, or immunocompromised conditions. People who received 1-2 doses of PPSV23 before age 65 years should receive another dose of PPSV23 vaccine at age 65 years or later if at least 5 years have passed since the previous dose. Doses of PPSV23 are not needed for people immunized with PPSV23 at or after age 65 years.  Meningococcal vaccine. Adults with asplenia or persistent complement component deficiencies should receive 2 doses of quadrivalent meningococcal conjugate (MenACWY-D) vaccine. The doses should be obtained at least 2 months apart.  Microbiologists working with certain meningococcal bacteria, military recruits, people at risk during an outbreak, and people who travel to or live in countries with a high rate of meningitis should be immunized. A first-year college student up through age   21 years who is living in a residence hall should receive a dose if she did not receive a dose on or after her 16th birthday. Adults who have certain high-risk conditions should receive one or more doses of vaccine.  Hepatitis A vaccine. Adults who wish to be protected from this disease, have certain high-risk conditions, work with hepatitis A-infected animals, work in hepatitis A research labs, or travel to or work in countries with a high rate of hepatitis A should be immunized. Adults who were previously unvaccinated and who anticipate close contact with an international adoptee during the first 60 days after arrival in the [place] from a country with a high rate of hepatitis A should be immunized.  Hepatitis B vaccine. Adults who wish to be protected from this disease, have certain high-risk conditions, may be exposed to blood or other infectious body fluids, are household contacts or sex partners of hepatitis B positive people, are clients or workers in certain care facilities, or travel to or work in countries with a high rate of hepatitis B should be immunized.  Haemophilus influenzae type b (Hib) vaccine. A previously unvaccinated person with asplenia or sickle cell disease or having a scheduled splenectomy should receive 1 dose of Hib vaccine. Regardless of previous immunization, a recipient of a hematopoietic stem cell transplant should receive a 3-dose series 6-12 months after her successful transplant. Hib vaccine is not recommended for adults with HIV infection. Preventive Services / Frequency Ages 64 to 68 years  Blood pressure check.** / Every 1 to 2 years.  Lipid and cholesterol check.** / Every 5 years beginning at age  22.  Clinical breast exam.** / Every 3 years for women in their 88s and 53s.  BRCA-related cancer risk assessment.** / For women who have family members with a BRCA-related cancer (breast, ovarian, tubal, or peritoneal cancers).  Pap test.** / Every 2 years from ages 90 through 51. Every 3 years starting at age 21 through age 56 or 3 with a history of 3 consecutive normal Pap tests.  HPV screening.** / Every 3 years from ages 24 through ages 1 to 46 with a history of 3 consecutive normal Pap tests.  Hepatitis C blood test.** / For any individual with known risks for hepatitis C.  Skin self-exam. / Monthly.  Influenza vaccine. / Every year.  Tetanus, diphtheria, and acellular pertussis (Tdap, Td) vaccine.** / Consult your health care provider. Pregnant women should receive 1 dose of Tdap vaccine during each pregnancy. 1 dose of Td every 10 years.  Varicella vaccine.** / Consult your health care provider. Pregnant females who do not have evidence of immunity should receive the first dose after pregnancy.  HPV vaccine. / 3 doses over 6 months, if 72 and younger. The vaccine is not recommended for use in pregnant females. However, pregnancy testing is not needed before receiving a dose.  Measles, mumps, rubella (MMR) vaccine.** / You need at least 1 dose of MMR if you were born in 1957 or later. You may also need a 2nd dose. For females of childbearing age, rubella immunity should be determined. If there is no evidence of immunity, females who are not pregnant should be vaccinated. If there is no evidence of immunity, females who are pregnant should delay immunization until after pregnancy.  Pneumococcal 13-valent conjugate (PCV13) vaccine.** / Consult your health care provider.  Pneumococcal polysaccharide (PPSV23) vaccine.** / 1 to 2 doses if you smoke cigarettes or if you have certain conditions.  Meningococcal vaccine.** /  1 dose if you are age 19 to 21 years and a first-year college  student living in a residence hall, or have one of several medical conditions, you need to get vaccinated against meningococcal disease. You may also need additional booster doses.  Hepatitis A vaccine.** / Consult your health care provider.  Hepatitis B vaccine.** / Consult your health care provider.  Haemophilus influenzae type b (Hib) vaccine.** / Consult your health care provider. Ages 40 to 64 years  Blood pressure check.** / Every 1 to 2 years.  Lipid and cholesterol check.** / Every 5 years beginning at age 20 years.  Lung cancer screening. / Every year if you are aged 55-80 years and have a 30-pack-year history of smoking and currently smoke or have quit within the past 15 years. Yearly screening is stopped once you have quit smoking for at least 15 years or develop a health problem that would prevent you from having lung cancer treatment.  Clinical breast exam.** / Every year after age 40 years.  BRCA-related cancer risk assessment.** / For women who have family members with a BRCA-related cancer (breast, ovarian, tubal, or peritoneal cancers).  Mammogram.** / Every year beginning at age 40 years and continuing for as long as you are in good health. Consult with your health care provider.  Pap test.** / Every 3 years starting at age 30 years through age 65 or 70 years with a history of 3 consecutive normal Pap tests.  HPV screening.** / Every 3 years from ages 30 years through ages 65 to 70 years with a history of 3 consecutive normal Pap tests.  Fecal occult blood test (FOBT) of stool. / Every year beginning at age 50 years and continuing until age 75 years. You may not need to do this test if you get a colonoscopy every 10 years.  Flexible sigmoidoscopy or colonoscopy.** / Every 5 years for a flexible sigmoidoscopy or every 10 years for a colonoscopy beginning at age 50 years and continuing until age 75 years.  Hepatitis C blood test.** / For all people born from 1945 through  1965 and any individual with known risks for hepatitis C.  Skin self-exam. / Monthly.  Influenza vaccine. / Every year.  Tetanus, diphtheria, and acellular pertussis (Tdap/Td) vaccine.** / Consult your health care provider. Pregnant women should receive 1 dose of Tdap vaccine during each pregnancy. 1 dose of Td every 10 years.  Varicella vaccine.** / Consult your health care provider. Pregnant females who do not have evidence of immunity should receive the first dose after pregnancy.  Zoster vaccine.** / 1 dose for adults aged 60 years or older.  Measles, mumps, rubella (MMR) vaccine.** / You need at least 1 dose of MMR if you were born in 1957 or later. You may also need a 2nd dose. For females of childbearing age, rubella immunity should be determined. If there is no evidence of immunity, females who are not pregnant should be vaccinated. If there is no evidence of immunity, females who are pregnant should delay immunization until after pregnancy.  Pneumococcal 13-valent conjugate (PCV13) vaccine.** / Consult your health care provider.  Pneumococcal polysaccharide (PPSV23) vaccine.** / 1 to 2 doses if you smoke cigarettes or if you have certain conditions.  Meningococcal vaccine.** / Consult your health care provider.  Hepatitis A vaccine.** / Consult your health care provider.  Hepatitis B vaccine.** / Consult your health care provider.  Haemophilus influenzae type b (Hib) vaccine.** / Consult your health care provider. Ages 65   years and over  Blood pressure check.** / Every 1 to 2 years.  Lipid and cholesterol check.** / Every 5 years beginning at age 22 years.  Lung cancer screening. / Every year if you are aged 73-80 years and have a 30-pack-year history of smoking and currently smoke or have quit within the past 15 years. Yearly screening is stopped once you have quit smoking for at least 15 years or develop a health problem that would prevent you from having lung cancer  treatment.  Clinical breast exam.** / Every year after age 4 years.  BRCA-related cancer risk assessment.** / For women who have family members with a BRCA-related cancer (breast, ovarian, tubal, or peritoneal cancers).  Mammogram.** / Every year beginning at age 40 years and continuing for as long as you are in good health. Consult with your health care provider.  Pap test.** / Every 3 years starting at age 9 years through age 34 or 91 years with 3 consecutive normal Pap tests. Testing can be stopped between 65 and 70 years with 3 consecutive normal Pap tests and no abnormal Pap or HPV tests in the past 10 years.  HPV screening.** / Every 3 years from ages 57 years through ages 64 or 45 years with a history of 3 consecutive normal Pap tests. Testing can be stopped between 65 and 70 years with 3 consecutive normal Pap tests and no abnormal Pap or HPV tests in the past 10 years.  Fecal occult blood test (FOBT) of stool. / Every year beginning at age 15 years and continuing until age 17 years. You may not need to do this test if you get a colonoscopy every 10 years.  Flexible sigmoidoscopy or colonoscopy.** / Every 5 years for a flexible sigmoidoscopy or every 10 years for a colonoscopy beginning at age 86 years and continuing until age 71 years.  Hepatitis C blood test.** / For all people born from [birth year] through 1967 and any individual with known risks for hepatitis C.  Osteoporosis screening.** / A one-time screening for women ages 83 years and over and women at risk for fractures or osteoporosis.  Skin self-exam. / Monthly.  Influenza vaccine. / Every year.  Tetanus, diphtheria, and acellular pertussis (Tdap/Td) vaccine.** / 1 dose of Td every 10 years.  Varicella vaccine.** / Consult your health care provider.  Zoster vaccine.** / 1 dose for adults aged 61 years or older.  Pneumococcal 13-valent conjugate (PCV13) vaccine.** / Consult your health care provider.  Pneumococcal  polysaccharide (PPSV23) vaccine.** / 1 dose for all adults aged 28 years and older.  Meningococcal vaccine.** / Consult your health care provider.  Hepatitis A vaccine.** / Consult your health care provider.  Hepatitis B vaccine.** / Consult your health care provider.  Haemophilus influenzae type b (Hib) vaccine.** / Consult your health care provider. ** Family history and personal history of risk and conditions may change your health care provider's recommendations. Document Released: 07/04/2001 Document Revised: 09/22/2013 Document Reviewed: 10/03/2010 [facility] Patient Information 2015 [place], [place]. This information is not intended to replace advice given to you by your health care provider. Make sure you discuss any questions you have with your health care provider.

## 2014-01-13 NOTE — Progress Notes (Signed)
Pre visit review using our clinic review tool, if applicable. No additional management support is needed unless otherwise documented below in the visit note. 

## 2014-01-13 NOTE — Progress Notes (Signed)
Subjective:     Tracy Mathews is a 48 y.o. female and is here for a comprehensive physical exam. The patient reports problems - still having chest pains-- stress test is tomorrow.  History   Social History  . Marital Status: Single    Spouse Name: N/A    Number of Children: N/A  . Years of Education: N/A   Occupational History  . manager [employer]    Social History Main Topics  . Smoking status: Never Smoker   . Smokeless tobacco: Never Used  . Alcohol Use: Yes     Comment: occasionally  . Drug Use: No  . Sexual Activity: Yes    Partners: Male   Other Topics Concern  . Not on file   Social History Narrative   Exercising--no   Health Maintenance  Topic Date Due  . Mammogram  12/14/2012  . Colonoscopy  06/07/2013  . Influenza Vaccine  01/14/2015 (Originally 12/20/2013)  . Pap Smear  11/07/2014  . Tetanus/tdap  11/06/2021    The following portions of the patient's history were reviewed and updated as appropriate:  She  has a past medical history of Hypothyroidism; Hives; Allergy; Hemorrhoid; Arthritis; and Hyperlipidemia. She  does not have any pertinent problems on file. She  has past surgical history that includes none and Trigger finger release (Right, 10/18/2012). Her family history includes Arthritis in her father and mother; Diabetes in her brother and mother; Heart attack in her mother; Heart disease in her maternal uncle, maternal uncle, and maternal uncle; Heart disease (age of onset: 32) in her mother; Hyperlipidemia in her sister; Hypertension in her brother, brother, mother, and sister; Pulmonary fibrosis in her maternal uncle; Stroke in her father and mother. She  reports that she has never smoked. She has never used smokeless tobacco. She reports that she drinks alcohol. She reports that she does not use illicit drugs. She has a current medication list which includes the following prescription(s): cetirizine, coq10, esomeprazole, flax seed oil,  glucosamine-chondroitin, hydrochlorothiazide, levothyroxine, pitavastatin calcium, potassium chloride sa, tramadol, and vitamin b-12. Current Outpatient Prescriptions on File Prior to Visit  Medication Sig Dispense Refill  . cetirizine (ZYRTEC) 10 MG tablet Take 10 mg by mouth daily.        . Coenzyme Q10 (COQ10) 200 MG CAPS Take by mouth.      . esomeprazole (NEXIUM) 40 MG capsule TAKE 1 CAPSULE BY MOUTH DAILY  30 capsule  11  . glucosamine-chondroitin 500-400 MG tablet Take 1 tablet by mouth daily.      . hydrochlorothiazide (HYDRODIURIL) 25 MG tablet TAKE 1 TABLET BY MOUTH EVERY DAY AS NEEDED  90 tablet  0  . levothyroxine (SYNTHROID, LEVOTHROID) 112 MCG tablet TAKE 1 TABLET BY MOUTH EVERY DAY  30 tablet  1  . Pitavastatin Calcium 2 MG TABS Take 1 tablet (2 mg total) by mouth daily.  30 tablet  2  . potassium chloride SA (K-DUR,KLOR-CON) 20 MEQ tablet TAKE 2 TABLETS BY MOUTH EVERY DAY  60 tablet  5  . traMADol (ULTRAM) 50 MG tablet Take 1 tablet (50 mg total) by mouth every 6 (six) hours as needed.  30 tablet  0   No current facility-administered medications on file prior to visit.   She is allergic to simvastatin..  Review of Systems Review of Systems  Constitutional: Negative for activity change, appetite change and fatigue.  HENT: Negative for hearing loss, congestion, tinnitus and ear discharge.  dentist ---due--last visit 2 years ago  Eyes: Negative for visual disturbance (see optho q1y -- vision corrected to 20/20 with glasses).  Respiratory: Negative for cough, chest tightness and shortness of breath.   Cardiovascular: Negative for  palpitations and leg swelling.   + chest pain occasionally-- last episode 2 weeks ago Gastrointestinal: Negative for abdominal pain, diarrhea, constipation and abdominal distention.  Genitourinary: Negative for urgency, frequency, decreased urine volume and difficulty urinating.  Musculoskeletal: Negative for back pain, arthralgias and gait problem.   Skin: Negative for color change, pallor and rash.  Neurological: Negative for dizziness, light-headedness, numbness and headaches.  Hematological: Negative for adenopathy. Does not bruise/bleed easily.  Psychiatric/Behavioral: Negative for suicidal ideas, confusion, sleep disturbance, self-injury, dysphoric mood, decreased concentration and agitation.       Objective:    BP 116/80  Pulse 66  Temp(Src) 98 F (36.7 C) (Oral)  Ht 5\' 3"  (1.6 m)  Wt 209 lb (94.802 kg)  BMI 37.03 kg/m2  SpO2 98%  LMP 12/02/2013 General appearance: alert, cooperative, appears stated age and no distress Head: Normocephalic, without obvious abnormality, atraumatic Eyes: conjunctivae/corneas clear. PERRL, EOM's intact. Fundi benign. Ears: normal TM's and external ear canals both ears Nose: Nares normal. Septum midline. Mucosa normal. No drainage or sinus tenderness. Throat: lips, mucosa, and tongue normal; teeth and gums normal Neck: no adenopathy, no carotid bruit, no JVD, supple, symmetrical, trachea midline and thyroid not enlarged, symmetric, no tenderness/mass/nodules Back: symmetric, no curvature. ROM normal. No CVA tenderness. Lungs: clear to auscultation bilaterally Breasts: normal appearance, no masses or tenderness Heart: regular rate and rhythm, S1, S2 normal, no murmur, click, rub or gallop Abdomen: soft, non-tender; bowel sounds normal; no masses,  no organomegaly Pelvic: deferred Extremities: extremities normal, atraumatic, no cyanosis or edema Pulses: 2+ and symmetric Skin: Skin color, texture, turgor normal. No rashes or lesions Lymph nodes: Cervical, supraclavicular, and axillary nodes normal. Neurologic: Alert and oriented X 3, normal strength and tone. Normal symmetric reflexes. Normal coordination and gait Psych--no depression, no anxiety      Assessment:    Healthy female exam.       Plan:    ghm utd  Check labs Colonoscopy ordered See After Visit Summary for Counseling  Recommendations   1. Other screening mammogram   - MM DIGITAL SCREENING BILATERAL; Future  2. Preventative health care Check labs - Basic metabolic panel - CBC with Differential - Hepatic function panel - Lipid panel - POCT urinalysis dipstick - TSH - Ambulatory referral to Gastroenterology  3. Anxiety state, unspecified ?if cause of chest pain-- pt seeing cardiology, stress test tomorrow - ALPRAZolam (XANAX) 0.25 MG tablet; Take 1 tablet (0.25 mg total) by mouth 3 (three) times daily as needed for anxiety.  Dispense: 20 tablet; Refill: 0

## 2014-01-14 ENCOUNTER — Ambulatory Visit (INDEPENDENT_AMBULATORY_CARE_PROVIDER_SITE_OTHER): Payer: BC Managed Care – PPO | Admitting: Physician Assistant

## 2014-01-14 ENCOUNTER — Telehealth: Payer: Self-pay | Admitting: Family Medicine

## 2014-01-14 ENCOUNTER — Other Ambulatory Visit: Payer: Self-pay

## 2014-01-14 DIAGNOSIS — R0789 Other chest pain: Secondary | ICD-10-CM

## 2014-01-14 DIAGNOSIS — R9439 Abnormal result of other cardiovascular function study: Secondary | ICD-10-CM

## 2014-01-14 MED ORDER — PITAVASTATIN CALCIUM 4 MG PO TABS: 1.0000 | ORAL_TABLET | Freq: Every day | ORAL | Status: DC

## 2014-01-14 NOTE — Progress Notes (Signed)
Exercise Treadmill Test  Pre-Exercise Testing Evaluation Rhythm: normal sinus  Rate: 60 bpm     Test  Exercise Tolerance Test Ordering MD: Daneen Schick, MD  Interpreting MD: Richardson Dopp, PA-C  Unique Test No: 1  Treadmill:  1  Indication for ETT: chest pain - rule out ischemia  Contraindication to ETT: No   Stress Modality: exercise - treadmill  Cardiac Imaging Performed: non   Protocol: standard Bruce - maximal  Max BP:  175/79  Max MPHR (bpm):  170 85% MPR (bpm):  145  MPHR obtained (bpm):  157 % MPHR obtained:  92  Reached 85% MPHR (min:sec):  4:01 Total Exercise Time (min-sec):  6:00  Workload in METS:  7.0 Borg Scale: 16  Reason ETT Terminated:  desired heart rate attained    ST Segment Analysis At Rest: normal ST segments - no evidence of significant ST depression With Exercise: borderline ST changes  Other Information Arrhythmia:  No Angina during ETT:  absent (0) Quality of ETT:  indeterminate  ETT Interpretation:  borderline (indeterminate) with non-specific ST changes  Comments: Fair exercise capacity. No chest pain. Normal BP response to exercise. Borderline inferolateral ST depression at peak exercise.  Cannot rule out ischemia.   Recommendations: I will arrange an ETT-Myoview for further evaluation. Signed,  Richardson Dopp, PA-C   01/14/2014 11:06 AM

## 2014-01-14 NOTE — Telephone Encounter (Signed)
Caller name: Malashia  Relation to pt: self Call back number: [phone] [facility] [phone]   Reason for call: requesting refill ALPRAZolam (XANAX) 0.25 MG tablet

## 2014-01-14 NOTE — Telephone Encounter (Signed)
Rx re-faxed    KP 

## 2014-01-15 LAB — POCT URINALYSIS DIPSTICK
Bilirubin, UA: NEGATIVE
Blood, UA: NEGATIVE
Glucose, UA: NEGATIVE
KETONES UA: NEGATIVE
LEUKOCYTES UA: NEGATIVE
NITRITE UA: NEGATIVE
PH UA: 6.5
PROTEIN UA: NEGATIVE
Spec Grav, UA: 1.01
Urobilinogen, UA: 0.2

## 2014-01-22 ENCOUNTER — Telehealth: Payer: Self-pay | Admitting: Family Medicine

## 2014-01-22 DIAGNOSIS — R222 Localized swelling, mass and lump, trunk: Secondary | ICD-10-CM

## 2014-01-22 DIAGNOSIS — N6320 Unspecified lump in the left breast, unspecified quadrant: Secondary | ICD-10-CM

## 2014-01-22 NOTE — Telephone Encounter (Signed)
Caller name: veronica from [facility] Relation to pt: Call back number:[phone] Pharmacy:  Reason for call:    Patient is there now for a mammogram and they do not have an order.

## 2014-01-22 NOTE — Telephone Encounter (Signed)
Spoke with Tracy Mathews with radiology ([facility]) requesting an order for [place] due to patient complaining of lump in Left Breast.  OK for [place] of left breast per Dr Etter Sjogren. Order entered.

## 2014-01-22 NOTE — Telephone Encounter (Signed)
Can you please place mammogram order as well and fax to [facility]

## 2014-01-27 ENCOUNTER — Other Ambulatory Visit: Payer: Self-pay | Admitting: Family Medicine

## 2014-01-27 ENCOUNTER — Ambulatory Visit (HOSPITAL_COMMUNITY): Payer: BC Managed Care – PPO | Attending: Cardiology | Admitting: Radiology

## 2014-01-27 VITALS — BP 132/78 | HR 75 | Ht 63.0 in | Wt 207.0 lb

## 2014-01-27 DIAGNOSIS — R5381 Other malaise: Secondary | ICD-10-CM | POA: Diagnosis not present

## 2014-01-27 DIAGNOSIS — R0789 Other chest pain: Secondary | ICD-10-CM

## 2014-01-27 DIAGNOSIS — E785 Hyperlipidemia, unspecified: Secondary | ICD-10-CM | POA: Insufficient documentation

## 2014-01-27 DIAGNOSIS — R079 Chest pain, unspecified: Secondary | ICD-10-CM | POA: Diagnosis present

## 2014-01-27 DIAGNOSIS — R9439 Abnormal result of other cardiovascular function study: Secondary | ICD-10-CM

## 2014-01-27 DIAGNOSIS — R5383 Other fatigue: Secondary | ICD-10-CM

## 2014-01-27 DIAGNOSIS — R002 Palpitations: Secondary | ICD-10-CM | POA: Diagnosis not present

## 2014-01-27 MED ORDER — TECHNETIUM TC 99M SESTAMIBI GENERIC - CARDIOLITE
33.0000 | Freq: Once | INTRAVENOUS | Status: AC | PRN
  Administered 2014-01-27: 33 via INTRAVENOUS

## 2014-01-27 MED ORDER — TECHNETIUM TC 99M SESTAMIBI GENERIC - CARDIOLITE
11.0000 | Freq: Once | INTRAVENOUS | Status: AC | PRN
  Administered 2014-01-27: 11 via INTRAVENOUS

## 2014-01-27 NOTE — Telephone Encounter (Signed)
There was an abnormality on mammogram -- we will refer to surgery for further evaluation and possible bx

## 2014-01-27 NOTE — Telephone Encounter (Signed)
Results placed in your red folder.     KP

## 2014-01-27 NOTE — Progress Notes (Signed)
[facility] [address] [place], [place] [address] [phone]    Cardiology Nuclear Med Study  Tracy Mathews is a 48 y.o. female     MRN : [id number]     DOB: 07-03-1965  Procedure Date: 01/27/2014  Nuclear Med Background Indication for Stress Test:  Evaluation for Ischemia, and 01-14-2014 Abnormal GXT;  Borderline (indeterminate) Nonspecific ST changes with exercise, walked 6 minutes History:  MIP ~75yrs ago (ok per pt) Cardiac Risk Factors: Lipids  Symptoms:  Chest Pain (last date of chest discomfort was four weeks ago) and Palpitations   Nuclear Pre-Procedure Caffeine/Decaff Intake:  None> 12 hrs NPO After: 7:30am   Lungs:  clear O2 Sat: 94% on room air. IV 0.9% NS with Angio Cath:  22g  IV Site: R Antecubital x 1, tolerated well IV Started by:  Irven Baltimore, RN  Chest Size (in):  36 Cup Size: DD  Height: 5\' 3"  (1.6 m)  Weight:  207 lb (93.895 kg)  BMI:  Body mass index is 36.68 kg/(m^2). Tech Comments:  Patient took Hctz this am. Irven Baltimore, RN.    Nuclear Med Study 1 or 2 day study: 1 day  Stress Test Type:  Stress  Reading MD: N/A  Order Authorizing Provider:  Daneen Schick, III, MD, and Richardson Dopp, PAC  Resting Radionuclide: Technetium 72m Sestamibi  Resting Radionuclide Dose: 11.0 mCi   Stress Radionuclide:  Technetium 55m Sestamibi  Stress Radionuclide Dose: 33.0 mCi           Stress Protocol Rest HR: 75 Stress HR: 157  Rest BP: 132/78 Stress BP: 126/86  Exercise Time (min): 6:00 METS: 7.0           Dose of Adenosine (mg):  n/a Dose of Lexiscan: n/a mg  Dose of Atropine (mg): n/a Dose of Dobutamine: n/a mcg/kg/min (at max HR)  Stress Test Technologist: Glade Lloyd, BS-ES  Nuclear Technologist:  Annye Rusk, CNMT     Rest Procedure:  Myocardial perfusion imaging was performed at rest 45 minutes following the intravenous administration of Technetium 46m Sestamibi. Rest ECG: Sinus rhythm, poor R wave progression,  nonspecific ST T wave flattening  Stress Procedure:  The patient exercised on the treadmill utilizing the Bruce Protocol for 6:00 minutes. The patient stopped due to fatigue and denied any chest pain.  Technetium 59m Sestamibi was injected at peak exercise and myocardial perfusion imaging was performed after a brief delay. Stress ECG: There was minimal accentuation of nonspecific ST-T wave changes. No significant ST segment depression noted.  QPS Raw Data Images:  Mild breast attenuation; normal left ventricular size. Stress Images:  Normal homogeneous uptake in all areas of the myocardium. Rest Images:  Normal homogeneous uptake in all areas of the myocardium. Subtraction (SDS):  No evidence of ischemia. Transient Ischemic Dilatation (Normal <1.22):  0.99 Lung/Heart Ratio (Normal <0.45):  0.32  Quantitative Gated Spect Images QGS EDV:  64 ml QGS ESV:  26 ml  Impression Exercise Capacity:  Fair exercise capacity. 6 minutes, 7 METS BP Response:  Normal blood pressure response. Clinical Symptoms:  No symptoms. ECG Impression:  No significant ST segment change suggestive of ischemia. Comparison with Prior Nuclear Study: No images to compare  Overall Impression:  Low risk stress nuclear study with no ischemia identified.  LV Ejection Fraction: 60%.  LV Wall Motion:  NL LV Function; NL Wall Motion  Candee Furbish, MD

## 2014-01-27 NOTE — Telephone Encounter (Signed)
Pt would like to discuss mamo results 01/22/14 and would like to schedule a biopsy. Please advise

## 2014-01-28 ENCOUNTER — Encounter: Payer: Self-pay | Admitting: Physician Assistant

## 2014-01-28 ENCOUNTER — Telehealth: Payer: Self-pay

## 2014-01-28 DIAGNOSIS — N63 Unspecified lump in unspecified breast: Secondary | ICD-10-CM

## 2014-01-28 NOTE — Telephone Encounter (Signed)
Veronica called for orders for diagnostic mammogram -- Faxed     KP

## 2014-01-28 NOTE — Telephone Encounter (Signed)
Message copied by Ewing Schlein on Wed Jan 28, 2014 10:46 AM ------      Message from: Irven Baltimore      Created: Wed Jan 28, 2014 10:39 AM       Please call Verdene Lennert at [facility] radiology [phone].  ------

## 2014-01-28 NOTE — Telephone Encounter (Signed)
Referral information faxed to [facility]      KP

## 2014-01-29 ENCOUNTER — Telehealth: Payer: Self-pay | Admitting: *Deleted

## 2014-01-29 NOTE — Telephone Encounter (Signed)
lmptcb for normal myoview results

## 2014-01-29 NOTE — Telephone Encounter (Signed)
ptcb and has been notified about normal myoview results with verbal understanding

## 2014-02-18 ENCOUNTER — Other Ambulatory Visit: Payer: Self-pay

## 2014-02-18 MED ORDER — LEVOTHYROXINE SODIUM 112 MCG PO TABS: ORAL_TABLET | ORAL | Status: DC

## 2014-02-19 ENCOUNTER — Encounter: Payer: Self-pay | Admitting: Family Medicine

## 2014-03-05 ENCOUNTER — Other Ambulatory Visit: Payer: Self-pay | Admitting: Family Medicine

## 2014-05-07 ENCOUNTER — Other Ambulatory Visit: Payer: Self-pay | Admitting: Family Medicine

## 2014-05-20 ENCOUNTER — Ambulatory Visit: Payer: BC Managed Care – PPO | Admitting: Family Medicine

## 2014-05-21 ENCOUNTER — Ambulatory Visit: Payer: BC Managed Care – PPO | Admitting: Family Medicine

## 2014-06-09 ENCOUNTER — Other Ambulatory Visit: Payer: Self-pay | Admitting: Family Medicine

## 2014-06-22 LAB — HM MAMMOGRAPHY

## 2014-07-08 ENCOUNTER — Other Ambulatory Visit: Payer: Self-pay | Admitting: Family Medicine

## 2014-08-05 ENCOUNTER — Other Ambulatory Visit: Payer: Self-pay | Admitting: Family Medicine

## 2014-08-05 DIAGNOSIS — E785 Hyperlipidemia, unspecified: Secondary | ICD-10-CM

## 2014-08-26 ENCOUNTER — Other Ambulatory Visit (INDEPENDENT_AMBULATORY_CARE_PROVIDER_SITE_OTHER): Payer: BLUE CROSS/BLUE SHIELD

## 2014-08-26 DIAGNOSIS — E785 Hyperlipidemia, unspecified: Secondary | ICD-10-CM | POA: Diagnosis not present

## 2014-08-26 LAB — HEPATIC FUNCTION PANEL
ALT: 30 U/L (ref 0–35)
AST: 18 U/L (ref 0–37)
Albumin: 3.9 g/dL (ref 3.5–5.2)
Alkaline Phosphatase: 93 U/L (ref 39–117)
BILIRUBIN DIRECT: 0.1 mg/dL (ref 0.0–0.3)
TOTAL PROTEIN: 6.6 g/dL (ref 6.0–8.3)
Total Bilirubin: 0.4 mg/dL (ref 0.2–1.2)

## 2014-08-26 LAB — LIPID PANEL
CHOL/HDL RATIO: 4
Cholesterol: 196 mg/dL (ref 0–200)
HDL: 53.7 mg/dL (ref >39.00)
LDL CALC: 116 mg/dL — AB (ref 0–99)
NONHDL: 142.3
Triglycerides: 134 mg/dL (ref 0.0–149.0)
VLDL: 26.8 mg/dL (ref 0.0–40.0)

## 2014-08-31 ENCOUNTER — Other Ambulatory Visit: Payer: Self-pay | Admitting: Family Medicine

## 2014-09-03 ENCOUNTER — Other Ambulatory Visit: Payer: Self-pay | Admitting: Family Medicine

## 2014-09-03 ENCOUNTER — Encounter: Payer: Self-pay | Admitting: Family Medicine

## 2014-09-26 ENCOUNTER — Other Ambulatory Visit: Payer: Self-pay | Admitting: Family Medicine

## 2014-10-29 ENCOUNTER — Other Ambulatory Visit: Payer: Self-pay | Admitting: Family Medicine

## 2014-11-21 ENCOUNTER — Other Ambulatory Visit: Payer: Self-pay | Admitting: Family Medicine

## 2014-11-29 ENCOUNTER — Other Ambulatory Visit: Payer: Self-pay | Admitting: Family Medicine

## 2015-01-11 ENCOUNTER — Telehealth: Payer: Self-pay | Admitting: Behavioral Health

## 2015-01-11 ENCOUNTER — Encounter: Payer: Self-pay | Admitting: Behavioral Health

## 2015-01-11 NOTE — Telephone Encounter (Signed)
Pre-Visit Call completed with patient and chart updated.   Pre-Visit Info documented in Specialty Comments under [employer].    

## 2015-01-12 ENCOUNTER — Encounter: Payer: Self-pay | Admitting: Family Medicine

## 2015-01-12 ENCOUNTER — Ambulatory Visit (INDEPENDENT_AMBULATORY_CARE_PROVIDER_SITE_OTHER): Payer: BLUE CROSS/BLUE SHIELD | Admitting: Family Medicine

## 2015-01-12 ENCOUNTER — Other Ambulatory Visit (HOSPITAL_COMMUNITY)
Admission: RE | Admit: 2015-01-12 | Discharge: 2015-01-12 | Disposition: A | Discharge status: Home / Self Care | Payer: BLUE CROSS/BLUE SHIELD | Source: Ambulatory Visit | Attending: Family Medicine | Admitting: Family Medicine

## 2015-01-12 VITALS — BP 130/76 | HR 77 | Temp 98.3°F | Ht 64.0 in | Wt 214.0 lb

## 2015-01-12 DIAGNOSIS — Z1211 Encounter for screening for malignant neoplasm of colon: Secondary | ICD-10-CM

## 2015-01-12 DIAGNOSIS — Z0000 Encounter for general adult medical examination without abnormal findings: Secondary | ICD-10-CM | POA: Diagnosis not present

## 2015-01-12 DIAGNOSIS — E039 Hypothyroidism, unspecified: Secondary | ICD-10-CM | POA: Diagnosis not present

## 2015-01-12 DIAGNOSIS — E785 Hyperlipidemia, unspecified: Secondary | ICD-10-CM | POA: Diagnosis not present

## 2015-01-12 DIAGNOSIS — Z124 Encounter for screening for malignant neoplasm of cervix: Secondary | ICD-10-CM | POA: Diagnosis not present

## 2015-01-12 DIAGNOSIS — Z01419 Encounter for gynecological examination (general) (routine) without abnormal findings: Secondary | ICD-10-CM | POA: Diagnosis present

## 2015-01-12 LAB — POCT URINALYSIS DIPSTICK
Bilirubin, UA: NEGATIVE
Glucose, UA: NEGATIVE
Ketones, UA: NEGATIVE
Leukocytes, UA: NEGATIVE
Nitrite, UA: NEGATIVE
Protein, UA: NEGATIVE
RBC UA: NEGATIVE
SPEC GRAV UA: 1.015
Urobilinogen, UA: 0.2
pH, UA: 6

## 2015-01-12 NOTE — Progress Notes (Signed)
Subjective:     Tracy Mathews is a 49 y.o. female and is here for a comprehensive physical exam. The patient reports no problems.  Social History   Social History  . Marital Status: Single    Spouse Name: N/A  . Number of Children: N/A  . Years of Education: N/A   Occupational History  . manager [employer]    Social History Main Topics  . Smoking status: Never Smoker   . Smokeless tobacco: Never Used  . Alcohol Use: Yes     Comment: occasionally  . Drug Use: No  . Sexual Activity:    Partners: Male   Other Topics Concern  . Not on file   Social History Narrative   Exercising--no   Health Maintenance  Topic Date Due  . Hepatitis C Screening  1966-02-27  . COLONOSCOPY  06/07/2013  . INFLUENZA VACCINE  03/14/2015 (Originally 12/21/2014)  . HIV Screening  01/12/2016 (Originally 06/07/1978)  . MAMMOGRAM  06/23/2015  . TETANUS/TDAP  11/06/2021    The following portions of the patient's history were reviewed and updated as appropriate:  She  has a past medical history of Hypothyroidism; Hives; Allergy; Hemorrhoid; Arthritis; Hyperlipidemia; and cardiovascular stress test. She  does not have any pertinent problems on file. She  has past surgical history that includes none and Trigger finger release (Right, 10/18/2012). Her family history includes Arthritis in her father and mother; Diabetes in her brother and mother; Heart attack in her mother; Heart disease in her maternal uncle, maternal uncle, and maternal uncle; Heart disease (age of onset: 36) in her mother; Hyperlipidemia in her sister; Hypertension in her brother, brother, mother, and sister; Pulmonary fibrosis in her maternal uncle; Stroke in her father and mother. She  reports that she has never smoked. She has never used smokeless tobacco. She reports that she drinks alcohol. She reports that she does not use illicit drugs. She has a current medication list which includes the following prescription(s):  cetirizine, coq10, esomeprazole, flax seed oil, glucosamine-chondroitin, hydrochlorothiazide, levothyroxine, pitavastatin calcium, potassium chloride sa, and vitamin b-12. Current Outpatient Prescriptions on File Prior to Visit  Medication Sig Dispense Refill  . cetirizine (ZYRTEC) 10 MG tablet Take 10 mg by mouth daily.      . Coenzyme Q10 (COQ10) 200 MG CAPS Take by mouth.    . esomeprazole (NEXIUM) 40 MG capsule Take 1 capsule (40 mg total) by mouth daily. 30 capsule 11  . Flaxseed, Linseed, (FLAX SEED OIL) 1000 MG CAPS Take by mouth.    Marland Kitchen glucosamine-chondroitin 500-400 MG tablet Take 1 tablet by mouth daily.    . hydrochlorothiazide (HYDRODIURIL) 25 MG tablet Take 1 tablet (25 mg total) by mouth daily. Office visit due now 90 tablet 0  . levothyroxine (SYNTHROID, LEVOTHROID) 112 MCG tablet TAKE 1 TABLET BY MOUTH EVERY DAY 30 tablet 11  . Pitavastatin Calcium (LIVALO) 4 MG TABS Take 1 tablet (4 mg total) by mouth daily. 30 tablet 1  . potassium chloride SA (K-DUR,KLOR-CON) 20 MEQ tablet TAKE 2 TABLETS BY MOUTH EVERY DAY 60 tablet 2  . vitamin B-12 (CYANOCOBALAMIN) 1000 MCG tablet Take 1,000 mcg by mouth daily.     No current facility-administered medications on file prior to visit.   She is allergic to niacin and related and simvastatin..  Review of Systems Review of Systems  Constitutional: Negative for activity change, appetite change and fatigue.  HENT: Negative for hearing loss, congestion, tinnitus and ear discharge.  dentist q36m Eyes:  Negative for visual disturbance (see optho q1y -- vision corrected to 20/20 with glasses).  Respiratory: Negative for cough, chest tightness and shortness of breath.   Cardiovascular: Negative for chest pain, palpitations and leg swelling.  Gastrointestinal: Negative for abdominal pain, diarrhea, constipation and abdominal distention.  Genitourinary: Negative for urgency, frequency, decreased urine volume and difficulty urinating.  Musculoskeletal:  Negative for back pain, arthralgias and gait problem.  Skin: Negative for color change, pallor and rash.  Neurological: Negative for dizziness, light-headedness, numbness and headaches.  Hematological: Negative for adenopathy. Does not bruise/bleed easily.  Psychiatric/Behavioral: Negative for suicidal ideas, confusion, sleep disturbance, self-injury, dysphoric mood, decreased concentration and agitation.       Objective:    BP 130/76 mmHg  Pulse 77  Temp(Src) 98.3 F (36.8 C) (Oral)  Ht 5\' 4"  (1.626 m)  Wt 214 lb (97.07 kg)  BMI 36.72 kg/m2  SpO2 98%  LMP 11/04/2014 (Within Weeks) General appearance: alert, cooperative, appears stated age and no distress Head: Normocephalic, without obvious abnormality, atraumatic Eyes: conjunctivae/corneas clear. PERRL, EOM's intact. Fundi benign. Ears: normal TM's and external ear canals both ears Nose: Nares normal. Septum midline. Mucosa normal. No drainage or sinus tenderness. Throat: lips, mucosa, and tongue normal; teeth and gums normal Neck: no adenopathy, no carotid bruit, no JVD, supple, symmetrical, trachea midline and thyroid not enlarged, symmetric, no tenderness/mass/nodules Back: symmetric, no curvature. ROM normal. No CVA tenderness. Lungs: clear to auscultation bilaterally Breasts: normal appearance, no masses or tenderness Heart: regular rate and rhythm, S1, S2 normal, no murmur, click, rub or gallop Abdomen: soft, non-tender; bowel sounds normal; no masses,  no organomegaly Pelvic: cervix normal in appearance, external genitalia normal, no adnexal masses or tenderness, no cervical motion tenderness, rectovaginal septum normal, uterus normal size, shape, and consistency, vagina normal without discharge and pap done, heme neg brown stool Extremities: extremities normal, atraumatic, no cyanosis or edema Pulses: 2+ and symmetric Skin: Skin color, texture, turgor normal. No rashes or lesions Lymph nodes: Cervical, supraclavicular,  and axillary nodes normal. Neurologic: Alert and oriented X 3, normal strength and tone. Normal symmetric reflexes. Normal coordination and gait Psych- no depression, no anxiety      Assessment:    Healthy female exam.      Plan:    ghm utd Check labs See After Visit Summary for Counseling Recommendations    1. Hypothyroidism, unspecified hypothyroidism type con't synthroid - TSH  2. Preventative health care See avs - Basic metabolic panel - CBC with Differential/Platelet - Hepatic function panel - Lipid panel - Microalbumin / creatinine urine ratio - POCT urinalysis dipstick - TSH - Hepatitis C Antibody - HIV antibody  3. Hyperlipidemia con'tlivalo - Hepatic function panel - Lipid panel  4. Screening for cervical cancer   - Cytology - PAP  5. Screening for colon cancer   - Ambulatory referral to Gastroenterology

## 2015-01-12 NOTE — Progress Notes (Signed)
Pre visit review using our clinic review tool, if applicable. No additional management support is needed unless otherwise documented below in the visit note. 

## 2015-01-12 NOTE — Patient Instructions (Signed)
Preventive Care for Adults A healthy lifestyle and preventive care can promote health and wellness. Preventive health guidelines for women include the following key practices.  A routine yearly physical is a good way to check with your health care provider about your health and preventive screening. It is a chance to share any concerns and updates on your health and to receive a thorough exam.  Visit your dentist for a routine exam and preventive care every 6 months. Brush your teeth twice a day and floss once a day. Good oral hygiene prevents tooth decay and gum disease.  The frequency of eye exams is based on your age, health, family medical history, use of contact lenses, and other factors. Follow your health care provider's recommendations for frequency of eye exams.  Eat a healthy diet. Foods like vegetables, fruits, whole grains, low-fat dairy products, and lean protein foods contain the nutrients you need without too many calories. Decrease your intake of foods high in solid fats, added sugars, and salt. Eat the right amount of calories for you.Get information about a proper diet from your health care provider, if necessary.  Regular physical exercise is one of the most important things you can do for your health. Most adults should get at least 150 minutes of moderate-intensity exercise (any activity that increases your heart rate and causes you to sweat) each week. In addition, most adults need muscle-strengthening exercises on 2 or more days a week.  Maintain a healthy weight. The body mass index (BMI) is a screening tool to identify possible weight problems. It provides an estimate of body fat based on height and weight. Your health care provider can find your BMI and can help you achieve or maintain a healthy weight.For adults 20 years and older:  A BMI below 18.5 is considered underweight.  A BMI of 18.5 to 24.9 is normal.  A BMI of 25 to 29.9 is considered overweight.  A BMI of  30 and above is considered obese.  Maintain normal blood lipids and cholesterol levels by exercising and minimizing your intake of saturated fat. Eat a balanced diet with plenty of fruit and vegetables. Blood tests for lipids and cholesterol should begin at age 51 and be repeated every 5 years. If your lipid or cholesterol levels are high, you are over 50, or you are at high risk for heart disease, you may need your cholesterol levels checked more frequently.Ongoing high lipid and cholesterol levels should be treated with medicines if diet and exercise are not working.  If you smoke, find out from your health care provider how to quit. If you do not use tobacco, do not start.  Lung cancer screening is recommended for adults aged 22-80 years who are at high risk for developing lung cancer because of a history of smoking. A yearly low-dose CT scan of the lungs is recommended for people who have at least a 30-pack-year history of smoking and are a current smoker or have quit within the past 15 years. A pack year of smoking is smoking an average of 1 pack of cigarettes a day for 1 year (for example: 1 pack a day for 30 years or 2 packs a day for 15 years). Yearly screening should continue until the smoker has stopped smoking for at least 15 years. Yearly screening should be stopped for people who develop a health problem that would prevent them from having lung cancer treatment.  If you are pregnant, do not drink alcohol. If you are breastfeeding,  be very cautious about drinking alcohol. If you are not pregnant and choose to drink alcohol, do not have more than 1 drink per day. One drink is considered to be 12 ounces (355 mL) of beer, 5 ounces (148 mL) of wine, or 1.5 ounces (44 mL) of liquor.  Avoid use of street drugs. Do not share needles with anyone. Ask for help if you need support or instructions about stopping the use of drugs.  High blood pressure causes heart disease and increases the risk of  stroke. Your blood pressure should be checked at least every 1 to 2 years. Ongoing high blood pressure should be treated with medicines if weight loss and exercise do not work.  If you are 51-51 years old, ask your health care provider if you should take aspirin to prevent strokes.  Diabetes screening involves taking a blood sample to check your fasting blood sugar level. This should be done once every 3 years, after age 15, if you are within normal weight and without risk factors for diabetes. Testing should be considered at a younger age or be carried out more frequently if you are overweight and have at least 1 risk factor for diabetes.  Breast cancer screening is essential preventive care for women. You should practice "breast self-awareness." This means understanding the normal appearance and feel of your breasts and may include breast self-examination. Any changes detected, no matter how small, should be reported to a health care provider. Women in their 58s and 30s should have a clinical breast exam (CBE) by a health care provider as part of a regular health exam every 1 to 3 years. After age 16, women should have a CBE every year. Starting at age 53, women should consider having a mammogram (breast X-ray test) every year. Women who have a family history of breast cancer should talk to their health care provider about genetic screening. Women at a high risk of breast cancer should talk to their health care providers about having an MRI and a mammogram every year.  Breast cancer gene (BRCA)-related cancer risk assessment is recommended for women who have family members with BRCA-related cancers. BRCA-related cancers include breast, ovarian, tubal, and peritoneal cancers. Having family members with these cancers may be associated with an increased risk for harmful changes (mutations) in the breast cancer genes BRCA1 and BRCA2. Results of the assessment will determine the need for genetic counseling and  BRCA1 and BRCA2 testing.  Routine pelvic exams to screen for cancer are no longer recommended for nonpregnant women who are considered low risk for cancer of the pelvic organs (ovaries, uterus, and vagina) and who do not have symptoms. Ask your health care provider if a screening pelvic exam is right for you.  If you have had past treatment for cervical cancer or a condition that could lead to cancer, you need Pap tests and screening for cancer for at least 20 years after your treatment. If Pap tests have been discontinued, your risk factors (such as having a new sexual partner) need to be reassessed to determine if screening should be resumed. Some women have medical problems that increase the chance of getting cervical cancer. In these cases, your health care provider may recommend more frequent screening and Pap tests.  The HPV test is an additional test that may be used for cervical cancer screening. The HPV test looks for the virus that can cause the cell changes on the cervix. The cells collected during the Pap test can be  tested for HPV. The HPV test could be used to screen women aged 44 years and older, and should be used in women of any age who have unclear Pap test results. After the age of 35, women should have HPV testing at the same frequency as a Pap test.  Colorectal cancer can be detected and often prevented. Most routine colorectal cancer screening begins at the age of 89 years and continues through age 47 years. However, your health care provider may recommend screening at an earlier age if you have risk factors for colon cancer. On a yearly basis, your health care provider may provide home test kits to check for hidden blood in the stool. Use of a small camera at the end of a tube, to directly examine the colon (sigmoidoscopy or colonoscopy), can detect the earliest forms of colorectal cancer. Talk to your health care provider about this at age 80, when routine screening begins. Direct  exam of the colon should be repeated every 5-10 years through age 32 years, unless early forms of pre-cancerous polyps or small growths are found.  People who are at an increased risk for hepatitis B should be screened for this virus. You are considered at high risk for hepatitis B if:  You were born in a country where hepatitis B occurs often. Talk with your health care provider about which countries are considered high risk.  Your parents were born in a high-risk country and you have not received a shot to protect against hepatitis B (hepatitis B vaccine).  You have HIV or AIDS.  You use needles to inject street drugs.  You live with, or have sex with, someone who has hepatitis B.  You get hemodialysis treatment.  You take certain medicines for conditions like cancer, organ transplantation, and autoimmune conditions.  Hepatitis C blood testing is recommended for all people born from [birth year] through 10-02-1965 and any individual with known risks for hepatitis C.  Practice safe sex. Use condoms and avoid high-risk sexual practices to reduce the spread of sexually transmitted infections (STIs). STIs include gonorrhea, chlamydia, syphilis, trichomonas, herpes, HPV, and human immunodeficiency virus (HIV). Herpes, HIV, and HPV are viral illnesses that have no cure. They can result in disability, cancer, and death.  You should be screened for sexually transmitted illnesses (STIs) including gonorrhea and chlamydia if:  You are sexually active and are younger than 24 years.  You are older than 24 years and your health care provider tells you that you are at risk for this type of infection.  Your sexual activity has changed since you were last screened and you are at an increased risk for chlamydia or gonorrhea. Ask your health care provider if you are at risk.  If you are at risk of being infected with HIV, it is recommended that you take a prescription medicine daily to prevent HIV infection. This is  called preexposure prophylaxis (PrEP). You are considered at risk if:  You are a heterosexual woman, are sexually active, and are at increased risk for HIV infection.  You take drugs by injection.  You are sexually active with a partner who has HIV.  Talk with your health care provider about whether you are at high risk of being infected with HIV. If you choose to begin PrEP, you should first be tested for HIV. You should then be tested every 3 months for as long as you are taking PrEP.  Osteoporosis is a disease in which the bones lose minerals and strength  with aging. This can result in serious bone fractures or breaks. The risk of osteoporosis can be identified using a bone density scan. Women ages 65 years and over and women at risk for fractures or osteoporosis should discuss screening with their health care providers. Ask your health care provider whether you should take a calcium supplement or vitamin D to reduce the rate of osteoporosis.  Menopause can be associated with physical symptoms and risks. Hormone replacement therapy is available to decrease symptoms and risks. You should talk to your health care provider about whether hormone replacement therapy is right for you.  Use sunscreen. Apply sunscreen liberally and repeatedly throughout the day. You should seek shade when your shadow is shorter than you. Protect yourself by wearing long sleeves, pants, a wide-brimmed hat, and sunglasses year round, whenever you are outdoors.  Once a month, do a whole body skin exam, using a mirror to look at the skin on your back. Tell your health care provider of new moles, moles that have irregular borders, moles that are larger than a pencil eraser, or moles that have changed in shape or color.  Stay current with required vaccines (immunizations).  Influenza vaccine. All adults should be immunized every year.  Tetanus, diphtheria, and acellular pertussis (Td, Tdap) vaccine. Pregnant women should  receive 1 dose of Tdap vaccine during each pregnancy. The dose should be obtained regardless of the length of time since the last dose. Immunization is preferred during the 27th-36th week of gestation. An adult who has not previously received Tdap or who does not know her vaccine status should receive 1 dose of Tdap. This initial dose should be followed by tetanus and diphtheria toxoids (Td) booster doses every 10 years. Adults with an unknown or incomplete history of completing a 3-dose immunization series with Td-containing vaccines should begin or complete a primary immunization series including a Tdap dose. Adults should receive a Td booster every 10 years.  Varicella vaccine. An adult without evidence of immunity to varicella should receive 2 doses or a second dose if she has previously received 1 dose. Pregnant females who do not have evidence of immunity should receive the first dose after pregnancy. This first dose should be obtained before leaving the health care facility. The second dose should be obtained 4-8 weeks after the first dose.  Human papillomavirus (HPV) vaccine. Females aged 13-26 years who have not received the vaccine previously should obtain the 3-dose series. The vaccine is not recommended for use in pregnant females. However, pregnancy testing is not needed before receiving a dose. If a female is found to be pregnant after receiving a dose, no treatment is needed. In that case, the remaining doses should be delayed until after the pregnancy. Immunization is recommended for any person with an immunocompromised condition through the age of 26 years if she did not get any or all doses earlier. During the 3-dose series, the second dose should be obtained 4-8 weeks after the first dose. The third dose should be obtained 24 weeks after the first dose and 16 weeks after the second dose.  Zoster vaccine. One dose is recommended for adults aged 60 years or older unless certain conditions are  present.  Measles, mumps, and rubella (MMR) vaccine. Adults born before 1957 generally are considered immune to measles and mumps. Adults born in 1957 or later should have 1 or more doses of MMR vaccine unless there is a contraindication to the vaccine or there is laboratory evidence of immunity to   each of the three diseases. A routine second dose of MMR vaccine should be obtained at least 28 days after the first dose for students attending postsecondary schools, health care workers, or international travelers. People who received inactivated measles vaccine or an unknown type of measles vaccine during 1963-1967 should receive 2 doses of MMR vaccine. People who received inactivated mumps vaccine or an unknown type of mumps vaccine before 1979 and are at high risk for mumps infection should consider immunization with 2 doses of MMR vaccine. For females of childbearing age, rubella immunity should be determined. If there is no evidence of immunity, females who are not pregnant should be vaccinated. If there is no evidence of immunity, females who are pregnant should delay immunization until after pregnancy. Unvaccinated health care workers born before [birth year] who lack laboratory evidence of measles, mumps, or rubella immunity or laboratory confirmation of disease should consider measles and mumps immunization with 2 doses of MMR vaccine or rubella immunization with 1 dose of MMR vaccine.  Pneumococcal 13-valent conjugate (PCV13) vaccine. When indicated, a person who is uncertain of her immunization history and has no record of immunization should receive the PCV13 vaccine. An adult aged 64 years or older who has certain medical conditions and has not been previously immunized should receive 1 dose of PCV13 vaccine. This PCV13 should be followed with a dose of pneumococcal polysaccharide (PPSV23) vaccine. The PPSV23 vaccine dose should be obtained at least 8 weeks after the dose of PCV13 vaccine. An adult aged 59  years or older who has certain medical conditions and previously received 1 or more doses of PPSV23 vaccine should receive 1 dose of PCV13. The PCV13 vaccine dose should be obtained 1 or more years after the last PPSV23 vaccine dose.  Pneumococcal polysaccharide (PPSV23) vaccine. When PCV13 is also indicated, PCV13 should be obtained first. All adults aged 12 years and older should be immunized. An adult younger than age 76 years who has certain medical conditions should be immunized. Any person who resides in a nursing home or long-term care facility should be immunized. An adult smoker should be immunized. People with an immunocompromised condition and certain other conditions should receive both PCV13 and PPSV23 vaccines. People with human immunodeficiency virus (HIV) infection should be immunized as soon as possible after diagnosis. Immunization during chemotherapy or radiation therapy should be avoided. Routine use of PPSV23 vaccine is not recommended for American Indians, [place] Natives, or people younger than 65 years unless there are medical conditions that require PPSV23 vaccine. When indicated, people who have unknown immunization and have no record of immunization should receive PPSV23 vaccine. One-time revaccination 5 years after the first dose of PPSV23 is recommended for people aged 19-64 years who have chronic kidney failure, nephrotic syndrome, asplenia, or immunocompromised conditions. People who received 1-2 doses of PPSV23 before age 32 years should receive another dose of PPSV23 vaccine at age 69 years or later if at least 5 years have passed since the previous dose. Doses of PPSV23 are not needed for people immunized with PPSV23 at or after age 20 years.  Meningococcal vaccine. Adults with asplenia or persistent complement component deficiencies should receive 2 doses of quadrivalent meningococcal conjugate (MenACWY-D) vaccine. The doses should be obtained at least 2 months apart.  Microbiologists working with certain meningococcal bacteria, [employer] recruits, people at risk during an outbreak, and people who travel to or live in countries with a high rate of meningitis should be immunized. A first-year college student up through age  21 years who is living in a residence hall should receive a dose if she did not receive a dose on or after her 16th birthday. Adults who have certain high-risk conditions should receive one or more doses of vaccine.  Hepatitis A vaccine. Adults who wish to be protected from this disease, have certain high-risk conditions, work with hepatitis A-infected animals, work in hepatitis A research labs, or travel to or work in countries with a high rate of hepatitis A should be immunized. Adults who were previously unvaccinated and who anticipate close contact with an international adoptee during the first 60 days after arrival in the [place] from a country with a high rate of hepatitis A should be immunized.  Hepatitis B vaccine. Adults who wish to be protected from this disease, have certain high-risk conditions, may be exposed to blood or other infectious body fluids, are household contacts or sex partners of hepatitis B positive people, are clients or workers in certain care facilities, or travel to or work in countries with a high rate of hepatitis B should be immunized.  Haemophilus influenzae type b (Hib) vaccine. A previously unvaccinated person with asplenia or sickle cell disease or having a scheduled splenectomy should receive 1 dose of Hib vaccine. Regardless of previous immunization, a recipient of a hematopoietic stem cell transplant should receive a 3-dose series 6-12 months after her successful transplant. Hib vaccine is not recommended for adults with HIV infection. Preventive Services / Frequency Ages 71 to 69 years  Blood pressure check.** / Every 1 to 2 years.  Lipid and cholesterol check.** / Every 5 years beginning at age  52.  Clinical breast exam.** / Every 3 years for women in their 22s and 85s.  BRCA-related cancer risk assessment.** / For women who have family members with a BRCA-related cancer (breast, ovarian, tubal, or peritoneal cancers).  Pap test.** / Every 2 years from ages 58 through 23. Every 3 years starting at age 71 through age 81 or 60 with a history of 3 consecutive normal Pap tests.  HPV screening.** / Every 3 years from ages 80 through ages 30 to 59 with a history of 3 consecutive normal Pap tests.  Hepatitis C blood test.** / For any individual with known risks for hepatitis C.  Skin self-exam. / Monthly.  Influenza vaccine. / Every year.  Tetanus, diphtheria, and acellular pertussis (Tdap, Td) vaccine.** / Consult your health care provider. Pregnant women should receive 1 dose of Tdap vaccine during each pregnancy. 1 dose of Td every 10 years.  Varicella vaccine.** / Consult your health care provider. Pregnant females who do not have evidence of immunity should receive the first dose after pregnancy.  HPV vaccine. / 3 doses over 6 months, if 10 and younger. The vaccine is not recommended for use in pregnant females. However, pregnancy testing is not needed before receiving a dose.  Measles, mumps, rubella (MMR) vaccine.** / You need at least 1 dose of MMR if you were born in 1957 or later. You may also need a 2nd dose. For females of childbearing age, rubella immunity should be determined. If there is no evidence of immunity, females who are not pregnant should be vaccinated. If there is no evidence of immunity, females who are pregnant should delay immunization until after pregnancy.  Pneumococcal 13-valent conjugate (PCV13) vaccine.** / Consult your health care provider.  Pneumococcal polysaccharide (PPSV23) vaccine.** / 1 to 2 doses if you smoke cigarettes or if you have certain conditions.  Meningococcal vaccine.** /  1 dose if you are age 19 to 21 years and a first-year college  student living in a residence hall, or have one of several medical conditions, you need to get vaccinated against meningococcal disease. You may also need additional booster doses.  Hepatitis A vaccine.** / Consult your health care provider.  Hepatitis B vaccine.** / Consult your health care provider.  Haemophilus influenzae type b (Hib) vaccine.** / Consult your health care provider. Ages 40 to 64 years  Blood pressure check.** / Every 1 to 2 years.  Lipid and cholesterol check.** / Every 5 years beginning at age 20 years.  Lung cancer screening. / Every year if you are aged 55-80 years and have a 30-pack-year history of smoking and currently smoke or have quit within the past 15 years. Yearly screening is stopped once you have quit smoking for at least 15 years or develop a health problem that would prevent you from having lung cancer treatment.  Clinical breast exam.** / Every year after age 40 years.  BRCA-related cancer risk assessment.** / For women who have family members with a BRCA-related cancer (breast, ovarian, tubal, or peritoneal cancers).  Mammogram.** / Every year beginning at age 40 years and continuing for as long as you are in good health. Consult with your health care provider.  Pap test.** / Every 3 years starting at age 30 years through age 65 or 70 years with a history of 3 consecutive normal Pap tests.  HPV screening.** / Every 3 years from ages 30 years through ages 65 to 70 years with a history of 3 consecutive normal Pap tests.  Fecal occult blood test (FOBT) of stool. / Every year beginning at age 50 years and continuing until age 75 years. You may not need to do this test if you get a colonoscopy every 10 years.  Flexible sigmoidoscopy or colonoscopy.** / Every 5 years for a flexible sigmoidoscopy or every 10 years for a colonoscopy beginning at age 50 years and continuing until age 75 years.  Hepatitis C blood test.** / For all people born from 1945 through  1965 and any individual with known risks for hepatitis C.  Skin self-exam. / Monthly.  Influenza vaccine. / Every year.  Tetanus, diphtheria, and acellular pertussis (Tdap/Td) vaccine.** / Consult your health care provider. Pregnant women should receive 1 dose of Tdap vaccine during each pregnancy. 1 dose of Td every 10 years.  Varicella vaccine.** / Consult your health care provider. Pregnant females who do not have evidence of immunity should receive the first dose after pregnancy.  Zoster vaccine.** / 1 dose for adults aged 60 years or older.  Measles, mumps, rubella (MMR) vaccine.** / You need at least 1 dose of MMR if you were born in 1957 or later. You may also need a 2nd dose. For females of childbearing age, rubella immunity should be determined. If there is no evidence of immunity, females who are not pregnant should be vaccinated. If there is no evidence of immunity, females who are pregnant should delay immunization until after pregnancy.  Pneumococcal 13-valent conjugate (PCV13) vaccine.** / Consult your health care provider.  Pneumococcal polysaccharide (PPSV23) vaccine.** / 1 to 2 doses if you smoke cigarettes or if you have certain conditions.  Meningococcal vaccine.** / Consult your health care provider.  Hepatitis A vaccine.** / Consult your health care provider.  Hepatitis B vaccine.** / Consult your health care provider.  Haemophilus influenzae type b (Hib) vaccine.** / Consult your health care provider. Ages 65   years and over  Blood pressure check.** / Every 1 to 2 years.  Lipid and cholesterol check.** / Every 5 years beginning at age 64 years.  Lung cancer screening. / Every year if you are aged 51-80 years and have a 30-pack-year history of smoking and currently smoke or have quit within the past 15 years. Yearly screening is stopped once you have quit smoking for at least 15 years or develop a health problem that would prevent you from having lung cancer  treatment.  Clinical breast exam.** / Every year after age 3 years.  BRCA-related cancer risk assessment.** / For women who have family members with a BRCA-related cancer (breast, ovarian, tubal, or peritoneal cancers).  Mammogram.** / Every year beginning at age 25 years and continuing for as long as you are in good health. Consult with your health care provider.  Pap test.** / Every 3 years starting at age 26 years through age 74 or 28 years with 3 consecutive normal Pap tests. Testing can be stopped between 65 and 70 years with 3 consecutive normal Pap tests and no abnormal Pap or HPV tests in the past 10 years.  HPV screening.** / Every 3 years from ages 35 years through ages 22 or 59 years with a history of 3 consecutive normal Pap tests. Testing can be stopped between 65 and 70 years with 3 consecutive normal Pap tests and no abnormal Pap or HPV tests in the past 10 years.  Fecal occult blood test (FOBT) of stool. / Every year beginning at age 67 years and continuing until age 23 years. You may not need to do this test if you get a colonoscopy every 10 years.  Flexible sigmoidoscopy or colonoscopy.** / Every 5 years for a flexible sigmoidoscopy or every 10 years for a colonoscopy beginning at age 7 years and continuing until age 25 years.  Hepatitis C blood test.** / For all people born from [birth year] through 1966/03/24 and any individual with known risks for hepatitis C.  Osteoporosis screening.** / A one-time screening for women ages 16 years and over and women at risk for fractures or osteoporosis.  Skin self-exam. / Monthly.  Influenza vaccine. / Every year.  Tetanus, diphtheria, and acellular pertussis (Tdap/Td) vaccine.** / 1 dose of Td every 10 years.  Varicella vaccine.** / Consult your health care provider.  Zoster vaccine.** / 1 dose for adults aged 51 years or older.  Pneumococcal 13-valent conjugate (PCV13) vaccine.** / Consult your health care provider.  Pneumococcal  polysaccharide (PPSV23) vaccine.** / 1 dose for all adults aged 34 years and older.  Meningococcal vaccine.** / Consult your health care provider.  Hepatitis A vaccine.** / Consult your health care provider.  Hepatitis B vaccine.** / Consult your health care provider.  Haemophilus influenzae type b (Hib) vaccine.** / Consult your health care provider. ** Family history and personal history of risk and conditions may change your health care provider's recommendations. Document Released: 07/04/2001 Document Revised: 09/22/2013 Document Reviewed: 10/03/2010 [facility] Patient Information 2015 [place], [place]. This information is not intended to replace advice given to you by your health care provider. Make sure you discuss any questions you have with your health care provider.

## 2015-01-13 ENCOUNTER — Encounter: Payer: Self-pay | Admitting: *Deleted

## 2015-01-13 LAB — BASIC METABOLIC PANEL
BUN: 17 mg/dL (ref 6–23)
CO2: 28 mEq/L (ref 19–32)
Calcium: 9.4 mg/dL (ref 8.4–10.5)
Chloride: 102 mEq/L (ref 96–112)
Creatinine, Ser: 0.83 mg/dL (ref 0.40–1.20)
GFR: 76.85 mL/min (ref >60.00)
Glucose, Bld: 77 mg/dL (ref 70–99)
POTASSIUM: 3.7 meq/L (ref 3.5–5.1)
Sodium: 138 mEq/L (ref 135–145)

## 2015-01-13 LAB — CBC WITH DIFFERENTIAL/PLATELET
Basophils Absolute: 0 10*3/uL (ref 0.0–0.1)
Basophils Relative: 0.5 % (ref 0.0–3.0)
EOS ABS: 0.2 10*3/uL (ref 0.0–0.7)
EOS PCT: 2.1 % (ref 0.0–5.0)
HCT: 38.9 % (ref 36.0–46.0)
HEMOGLOBIN: 13 g/dL (ref 12.0–15.0)
Lymphocytes Relative: 22 % (ref 12.0–46.0)
Lymphs Abs: 1.9 10*3/uL (ref 0.7–4.0)
MCHC: 33.3 g/dL (ref 30.0–36.0)
MCV: 84.7 fl (ref 78.0–100.0)
MONO ABS: 0.5 10*3/uL (ref 0.1–1.0)
Monocytes Relative: 5.9 % (ref 3.0–12.0)
Neutro Abs: 6 10*3/uL (ref 1.4–7.7)
Neutrophils Relative %: 69.5 % (ref 43.0–77.0)
Platelets: 216 10*3/uL (ref 150.0–400.0)
RBC: 4.6 Mil/uL (ref 3.87–5.11)
RDW: 13.9 % (ref 11.5–15.5)
WBC: 8.7 10*3/uL (ref 4.0–10.5)

## 2015-01-13 LAB — LIPID PANEL
Cholesterol: 182 mg/dL (ref 0–200)
HDL: 50.5 mg/dL (ref >39.00)
LDL CALC: 99 mg/dL (ref 0–99)
NONHDL: 131.26
Total CHOL/HDL Ratio: 4
Triglycerides: 159 mg/dL — ABNORMAL HIGH (ref 0.0–149.0)
VLDL: 31.8 mg/dL (ref 0.0–40.0)

## 2015-01-13 LAB — HIV ANTIBODY (ROUTINE TESTING W REFLEX): HIV 1&2 Ab, 4th Generation: NONREACTIVE

## 2015-01-13 LAB — TSH: TSH: 5.47 u[IU]/mL — ABNORMAL HIGH (ref 0.35–4.50)

## 2015-01-13 LAB — HEPATIC FUNCTION PANEL
ALT: 20 U/L (ref 0–35)
AST: 17 U/L (ref 0–37)
Albumin: 4.2 g/dL (ref 3.5–5.2)
Alkaline Phosphatase: 83 U/L (ref 39–117)
BILIRUBIN DIRECT: 0.1 mg/dL (ref 0.0–0.3)
BILIRUBIN TOTAL: 0.3 mg/dL (ref 0.2–1.2)
Total Protein: 6.9 g/dL (ref 6.0–8.3)

## 2015-01-13 LAB — MICROALBUMIN / CREATININE URINE RATIO
CREATININE, U: 25.7 mg/dL
Microalb Creat Ratio: 2.7 mg/g (ref 0.0–30.0)

## 2015-01-13 LAB — HEPATITIS C ANTIBODY: HCV Ab: NEGATIVE

## 2015-01-15 LAB — CYTOLOGY - PAP

## 2015-01-19 ENCOUNTER — Other Ambulatory Visit: Payer: Self-pay | Admitting: Family Medicine

## 2015-01-19 DIAGNOSIS — E038 Other specified hypothyroidism: Secondary | ICD-10-CM

## 2015-01-19 MED ORDER — HYDROCHLOROTHIAZIDE 25 MG PO TABS: 25.0000 mg | ORAL_TABLET | Freq: Every day | ORAL | Status: DC

## 2015-01-19 MED ORDER — LEVOTHYROXINE SODIUM 125 MCG PO TABS
125.0000 ug | ORAL_TABLET | Freq: Every day | ORAL | Status: DC
Start: 2015-01-19 — End: 2015-04-08

## 2015-01-19 NOTE — Addendum Note (Signed)
Addended by: Ewing Schlein on: 01/19/2015 03:06 PM   Modules accepted: Orders

## 2015-01-24 ENCOUNTER — Other Ambulatory Visit: Payer: Self-pay | Admitting: Family Medicine

## 2015-01-29 ENCOUNTER — Encounter: Payer: Self-pay | Admitting: Gastroenterology

## 2015-02-08 ENCOUNTER — Encounter: Payer: Self-pay | Admitting: Family Medicine

## 2015-02-08 MED ORDER — PITAVASTATIN CALCIUM 4 MG PO TABS: ORAL_TABLET | ORAL | Status: DC

## 2015-02-25 ENCOUNTER — Other Ambulatory Visit: Payer: Self-pay | Admitting: Family Medicine

## 2015-03-12 ENCOUNTER — Encounter: Payer: Self-pay | Admitting: Family Medicine

## 2015-03-12 ENCOUNTER — Ambulatory Visit (AMBULATORY_SURGERY_CENTER): Payer: Self-pay

## 2015-03-12 VITALS — Ht 63.5 in | Wt 213.4 lb

## 2015-03-12 DIAGNOSIS — Z1211 Encounter for screening for malignant neoplasm of colon: Secondary | ICD-10-CM

## 2015-03-12 MED ORDER — SUPREP BOWEL PREP KIT 17.5-3.13-1.6 GM/177ML PO SOLN: 1.0000 | Freq: Once | ORAL | Status: DC

## 2015-03-12 NOTE — Progress Notes (Signed)
No allergies to eggs or soy No home oxygen No diet/weight loss meds No past problems with anesthesia  Has email  Refused emmi  

## 2015-03-15 MED ORDER — TRAMADOL HCL 50 MG PO TABS: 50.0000 mg | ORAL_TABLET | Freq: Four times a day (QID) | ORAL | Status: DC | PRN

## 2015-03-15 NOTE — Telephone Encounter (Signed)
Last seen 01/12/15 and filled 12/01/13 #30   Please advise     KP

## 2015-03-25 ENCOUNTER — Ambulatory Visit (AMBULATORY_SURGERY_CENTER): Payer: BLUE CROSS/BLUE SHIELD | Admitting: Gastroenterology

## 2015-03-25 ENCOUNTER — Encounter: Payer: Self-pay | Admitting: Gastroenterology

## 2015-03-25 VITALS — BP 127/78 | HR 63 | Temp 97.0°F | Resp 13 | Ht 63.5 in | Wt 213.0 lb

## 2015-03-25 DIAGNOSIS — Z1211 Encounter for screening for malignant neoplasm of colon: Secondary | ICD-10-CM | POA: Diagnosis present

## 2015-03-25 MED ORDER — SODIUM CHLORIDE 0.9 % IV SOLN: 500.0000 mL | INTRAVENOUS | Status: DC

## 2015-03-25 NOTE — Progress Notes (Signed)
Transferred to recovery room. A/O x3, pleased with MAC.  VSS.  Report to Penny, RN. 

## 2015-03-25 NOTE — Patient Instructions (Signed)
YOU HAD AN ENDOSCOPIC PROCEDURE TODAY AT THE [facility]:   Refer to the procedure report that was given to you for any specific questions about what was found during the examination.  If the procedure report does not answer your questions, please call your gastroenterologist to clarify.  If you requested that your care partner not be given the details of your procedure findings, then the procedure report has been included in a sealed envelope for you to review at your convenience later.  YOU SHOULD EXPECT: Some feelings of bloating in the abdomen. Passage of more gas than usual.  Walking can help get rid of the air that was put into your GI tract during the procedure and reduce the bloating. If you had a lower endoscopy (such as a colonoscopy or flexible sigmoidoscopy) you may notice spotting of blood in your stool or on the toilet paper. If you underwent a bowel prep for your procedure, you may not have a normal bowel movement for a few days.  Please Note:  You might notice some irritation and congestion in your nose or some drainage.  This is from the oxygen used during your procedure.  There is no need for concern and it should clear up in a day or so.  SYMPTOMS TO REPORT IMMEDIATELY:   Following lower endoscopy (colonoscopy or flexible sigmoidoscopy):  Excessive amounts of blood in the stool  Significant tenderness or worsening of abdominal pains  Swelling of the abdomen that is new, acute  Fever of 100F or higher    For urgent or emergent issues, a gastroenterologist can be reached at any hour by calling [phone].   DIET: Your first meal following the procedure should be a small meal and then it is ok to progress to your normal diet. Heavy or fried foods are harder to digest and may make you feel nauseous or bloated.  Likewise, meals heavy in dairy and vegetables can increase bloating.  Drink plenty of fluids but you should avoid alcoholic beverages for 24  hours.  ACTIVITY:  You should plan to take it easy for the rest of today and you should NOT DRIVE or use heavy machinery until tomorrow (because of the sedation medicines used during the test).    FOLLOW UP: Our staff will call the number listed on your records the next business day following your procedure to check on you and address any questions or concerns that you may have regarding the information given to you following your procedure. If we do not reach you, we will leave a message.  However, if you are feeling well and you are not experiencing any problems, there is no need to return our call.  We will assume that you have returned to your regular daily activities without incident.  If any biopsies were taken you will be contacted by phone or by letter within the next 1-3 weeks.  Please call us at [phone] if you have not heard about the biopsies in 3 weeks.    SIGNATURES/CONFIDENTIALITY: You and/or your care partner have signed paperwork which will be entered into your electronic medical record.  These signatures attest to the fact that that the information above on your After Visit Summary has been reviewed and is understood.  Full responsibility of the confidentiality of this discharge information lies with you and/or your care-partner.   Information on diverticulosis given to you today 

## 2015-03-25 NOTE — Op Note (Signed)
[facility]  [address] [place], [address]   COLONOSCOPY PROCEDURE REPORT  PATIENT: Tracy Mathews, Tracy Mathews  MR#: [id number] BIRTHDATE: [birth year] , 49  yrs. old GENDER: female ENDOSCOPIST: Yetta Flock, MD REFERRED BY: Garnet Koyanagi DO PROCEDURE DATE:  03/25/2015 PROCEDURE:   Colonoscopy, screening First Screening Colonoscopy - Avg.  risk and is 50 yrs.  old or older Yes.  Prior Negative Screening - Now for repeat screening. N/A  History of Adenoma - Now for follow-up colonoscopy & has been > or = to 3 yrs.  N/A  Polyps removed today? No Recommend repeat exam, <10 yrs? No ASA CLASS:   Class II INDICATIONS:Screening for colonic neoplasia and Colorectal Neoplasm Risk Assessment for this procedure is average risk. MEDICATIONS: Propofol 300 mg IV  DESCRIPTION OF PROCEDURE:   After the risks benefits and alternatives of the procedure were thoroughly explained, informed consent was obtained.  The digital rectal exam revealed no abnormalities of the rectum.   The LB 1528  endoscope was introduced through the anus and advanced to the cecum, which was identified by both the appendix and ileocecal valve. No adverse events experienced.   The quality of the prep was adequate  The instrument was then slowly withdrawn as the colon was fully examined. Estimated blood loss is zero unless otherwise noted in this procedure report.   COLON FINDINGS: There was mild diverticulosis noted in the sigmoid colon.   The examination was otherwise normal. No polyps or mass lesions appreciated.  Retroflexed views revealed internal hemorrhoids. The time to cecum = 5.7 Withdrawal time = 12.1   The scope was withdrawn and the procedure completed. COMPLICATIONS: There were no immediate complications.  ENDOSCOPIC IMPRESSION: 1.   Mild diverticulosis was noted in the sigmoid colon 2.   The examination was otherwise normal  RECOMMENDATIONS: Resume diet Resume medications Repeat  colonoscopy for colon cancer screening in 10 years  eSigned:  Yetta Flock, MD 03/25/2015 9:20 AM   cc: Garnet Koyanagi DO, the patient

## 2015-03-26 ENCOUNTER — Telehealth: Payer: Self-pay | Admitting: *Deleted

## 2015-03-26 NOTE — Telephone Encounter (Signed)
  Follow up Call-  Call back number 03/25/2015  Post procedure Call Back phone  # [phone] cell  Permission to leave phone message Yes     Patient questions:  Do you have a fever, pain , or abdominal swelling? No. Pain Score  0 *  Have you tolerated food without any problems? Yes.    Have you been able to return to your normal activities? Yes.    Do you have any questions about your discharge instructions: Diet   No. Medications  No. Follow up visit  No.  Do you have questions or concerns about your Care? No.  Actions: * If pain score is 4 or above: No action needed, pain <4.

## 2015-04-08 ENCOUNTER — Other Ambulatory Visit: Payer: Self-pay | Admitting: Family Medicine

## 2015-05-01 ENCOUNTER — Encounter: Payer: Self-pay | Admitting: Family Medicine

## 2015-05-03 MED ORDER — TRAMADOL HCL 50 MG PO TABS: 50.0000 mg | ORAL_TABLET | Freq: Four times a day (QID) | ORAL | Status: DC | PRN

## 2015-05-03 NOTE — Telephone Encounter (Signed)
Last seen 01/12/15 and filled 03/15/15 #30  Please advise     KP

## 2015-06-02 ENCOUNTER — Telehealth: Payer: Self-pay | Admitting: *Deleted

## 2015-06-02 NOTE — Telephone Encounter (Signed)
Received fax from pharmacy requesting PA for Livalo; initiated via Cover My Meds; received real-time Approval online, faxed to pharmacy/SLS 01/11

## 2015-06-04 NOTE — Telephone Encounter (Addendum)
Received fax with Approval for Livalo, valid 05/23/15 through 06/21/16; faxed to pharmacy at [phone] 01/13

## 2015-06-07 ENCOUNTER — Telehealth: Payer: Self-pay | Admitting: Family Medicine

## 2015-06-07 NOTE — Telephone Encounter (Signed)
mychart msg.  

## 2015-07-21 ENCOUNTER — Other Ambulatory Visit: Payer: Self-pay

## 2015-07-21 MED ORDER — HYDROCHLOROTHIAZIDE 25 MG PO TABS: 25.0000 mg | ORAL_TABLET | Freq: Every day | ORAL | Status: DC

## 2015-07-21 MED ORDER — ESOMEPRAZOLE MAGNESIUM 40 MG PO CPDR: 40.0000 mg | DELAYED_RELEASE_CAPSULE | Freq: Every day | ORAL | Status: DC

## 2015-07-21 MED ORDER — LEVOTHYROXINE SODIUM 125 MCG PO TABS: ORAL_TABLET | ORAL | Status: DC

## 2015-07-21 MED ORDER — POTASSIUM CHLORIDE CRYS ER 20 MEQ PO TBCR: 40.0000 meq | EXTENDED_RELEASE_TABLET | Freq: Every day | ORAL | Status: DC

## 2015-08-04 ENCOUNTER — Telehealth: Payer: Self-pay | Admitting: Family Medicine

## 2015-08-04 NOTE — Telephone Encounter (Signed)
lvm inquiring if patient received flu shot  °

## 2015-08-13 ENCOUNTER — Other Ambulatory Visit: Payer: Self-pay | Admitting: Family Medicine

## 2015-11-03 ENCOUNTER — Other Ambulatory Visit: Payer: Self-pay | Admitting: Oncology

## 2015-11-22 ENCOUNTER — Other Ambulatory Visit: Payer: Self-pay | Admitting: Family Medicine

## 2015-12-13 ENCOUNTER — Other Ambulatory Visit: Payer: Self-pay | Admitting: Family Medicine

## 2015-12-17 ENCOUNTER — Encounter: Payer: Self-pay | Admitting: Family Medicine

## 2015-12-17 DIAGNOSIS — M6740 Ganglion, unspecified site: Secondary | ICD-10-CM

## 2015-12-17 NOTE — Telephone Encounter (Signed)
Ok to refer to ortho  For ganglion cyst

## 2016-01-21 ENCOUNTER — Other Ambulatory Visit: Payer: Self-pay | Admitting: Family Medicine

## 2016-02-16 ENCOUNTER — Other Ambulatory Visit: Payer: Self-pay | Admitting: Orthopedic Surgery

## 2016-02-17 HISTORY — PX: GANGLION CYST EXCISION: SHX1691

## 2016-03-01 ENCOUNTER — Encounter: Payer: Self-pay | Admitting: Family Medicine

## 2016-03-01 DIAGNOSIS — M25562 Pain in left knee: Secondary | ICD-10-CM

## 2016-03-01 NOTE — Telephone Encounter (Signed)
I am fine with you placing the referral for knee pain to Ortho.

## 2016-03-01 NOTE — Telephone Encounter (Signed)
Pending appointment 03/20/16, she has not been seed since 2016.   KP

## 2016-03-02 ENCOUNTER — Encounter: Payer: Self-pay | Admitting: Family Medicine

## 2016-03-20 ENCOUNTER — Encounter: Payer: Self-pay | Admitting: Family Medicine

## 2016-03-20 ENCOUNTER — Ambulatory Visit (INDEPENDENT_AMBULATORY_CARE_PROVIDER_SITE_OTHER): Payer: BLUE CROSS/BLUE SHIELD | Admitting: Family Medicine

## 2016-03-20 VITALS — BP 130/88 | HR 80 | Temp 98.0°F | Resp 16 | Ht 63.5 in | Wt 215.6 lb

## 2016-03-20 DIAGNOSIS — Z0000 Encounter for general adult medical examination without abnormal findings: Secondary | ICD-10-CM | POA: Diagnosis not present

## 2016-03-20 DIAGNOSIS — E538 Deficiency of other specified B group vitamins: Secondary | ICD-10-CM

## 2016-03-20 DIAGNOSIS — E039 Hypothyroidism, unspecified: Secondary | ICD-10-CM

## 2016-03-20 DIAGNOSIS — E785 Hyperlipidemia, unspecified: Secondary | ICD-10-CM | POA: Diagnosis not present

## 2016-03-20 LAB — CBC WITH DIFFERENTIAL/PLATELET
BASOS PCT: 0.3 % (ref 0.0–3.0)
Basophils Absolute: 0 10*3/uL (ref 0.0–0.1)
EOS ABS: 0.2 10*3/uL (ref 0.0–0.7)
Eosinophils Relative: 2.4 % (ref 0.0–5.0)
HCT: 39 % (ref 36.0–46.0)
HEMOGLOBIN: 12.9 g/dL (ref 12.0–15.0)
Lymphocytes Relative: 27.4 % (ref 12.0–46.0)
Lymphs Abs: 1.8 10*3/uL (ref 0.7–4.0)
MCHC: 32.9 g/dL (ref 30.0–36.0)
MCV: 83.5 fl (ref 78.0–100.0)
MONO ABS: 0.5 10*3/uL (ref 0.1–1.0)
Monocytes Relative: 7.1 % (ref 3.0–12.0)
NEUTROS PCT: 62.8 % (ref 43.0–77.0)
Neutro Abs: 4.1 10*3/uL (ref 1.4–7.7)
Platelets: 296 10*3/uL (ref 150.0–400.0)
RBC: 4.67 Mil/uL (ref 3.87–5.11)
RDW: 14.4 % (ref 11.5–15.5)
WBC: 6.5 10*3/uL (ref 4.0–10.5)

## 2016-03-20 LAB — COMPREHENSIVE METABOLIC PANEL
ALT: 19 U/L (ref 0–35)
AST: 15 U/L (ref 0–37)
Albumin: 4.4 g/dL (ref 3.5–5.2)
Alkaline Phosphatase: 96 U/L (ref 39–117)
BUN: 21 mg/dL (ref 6–23)
CHLORIDE: 100 meq/L (ref 96–112)
CO2: 32 mEq/L (ref 19–32)
Calcium: 9.9 mg/dL (ref 8.4–10.5)
Creatinine, Ser: 0.99 mg/dL (ref 0.40–1.20)
GFR: 62.41 mL/min (ref >60.00)
GLUCOSE: 87 mg/dL (ref 70–99)
POTASSIUM: 3.9 meq/L (ref 3.5–5.1)
Sodium: 140 mEq/L (ref 135–145)
Total Bilirubin: 0.5 mg/dL (ref 0.2–1.2)
Total Protein: 7.4 g/dL (ref 6.0–8.3)

## 2016-03-20 LAB — POCT URINALYSIS DIPSTICK
Bilirubin, UA: NEGATIVE
Blood, UA: NEGATIVE
Glucose, UA: NEGATIVE
Ketones, UA: NEGATIVE
Leukocytes, UA: NEGATIVE
Nitrite, UA: NEGATIVE
PH UA: 6
PROTEIN UA: NEGATIVE
SPEC GRAV UA: 1.015
UROBILINOGEN UA: NEGATIVE

## 2016-03-20 LAB — LIPID PANEL
Cholesterol: 216 mg/dL — ABNORMAL HIGH (ref 0–200)
HDL: 57.7 mg/dL (ref >39.00)
LDL CALC: 135 mg/dL — AB (ref 0–99)
NONHDL: 158.7
Total CHOL/HDL Ratio: 4
Triglycerides: 119 mg/dL (ref 0.0–149.0)
VLDL: 23.8 mg/dL (ref 0.0–40.0)

## 2016-03-20 LAB — VITAMIN B12: Vitamin B-12: 459 pg/mL (ref 211–911)

## 2016-03-20 LAB — TSH: TSH: 3.42 u[IU]/mL (ref 0.35–4.50)

## 2016-03-20 NOTE — Progress Notes (Signed)
Subjective:     Tracy Mathews is a 51 y.o. female and is here for a comprehensive physical exam. The patient reports no problems.  Social History   Social History  . Marital status: Single    Spouse name: N/A  . Number of children: N/A  . Years of education: N/A   Occupational History  . manager [employer]    Social History Main Topics  . Smoking status: Never Smoker  . Smokeless tobacco: Never Used  . Alcohol use 0.0 oz/week     Comment: occasionally; "a couple times monthly"  . Drug use: No  . Sexual activity: Yes    Partners: Male   Other Topics Concern  . Not on file   Social History Narrative   Exercising--no   Health Maintenance  Topic Date Due  . MAMMOGRAM  02/22/2017  . PAP SMEAR  01/11/2018  . TETANUS/TDAP  11/06/2021  . COLONOSCOPY  03/24/2025  . INFLUENZA VACCINE  Addressed  . Hepatitis C Screening  Completed  . HIV Screening  Completed    The following portions of the patient's history were reviewed and updated as appropriate:  She  has a past medical history of Allergy; Arthritis; GERD (gastroesophageal reflux disease); Hemorrhoid; Hives; cardiovascular stress test; Hyperlipidemia; and Hypothyroidism. She  does not have any pertinent problems on file. She  has a past surgical history that includes none; Trigger finger release (Right, 10/18/2012); wisdom teeth removal; and Ganglion cyst excision (Left, 02/17/2016). Her family history includes Arthritis in her father and mother; Diabetes in her brother and mother; Heart attack in her mother; Heart disease in her maternal uncle, maternal uncle, and maternal uncle; Heart disease (age of onset: 28) in her mother; Hyperlipidemia in her sister; Hypertension in her brother, brother, mother, and sister; Pulmonary fibrosis in her maternal uncle; Stroke in her father and mother. She  reports that she has never smoked. She has never used smokeless tobacco. She reports that she drinks alcohol. She reports that  she does not use drugs. She has a current medication list which includes the following prescription(s): cetirizine, coq10, esomeprazole, flax seed oil, glucosamine-chondroitin, hydrochlorothiazide, levothyroxine, livalo, potassium chloride sa, tramadol, and vitamin b-12. Current Outpatient Prescriptions on File Prior to Visit  Medication Sig Dispense Refill  . cetirizine (ZYRTEC) 10 MG tablet Take 10 mg by mouth daily.      . Coenzyme Q10 (COQ10) 200 MG CAPS Take by mouth.    . esomeprazole (NEXIUM) 40 MG capsule TAKE 1 CAPSULE DAILY 90 capsule 1  . Flaxseed, Linseed, (FLAX SEED OIL) 1000 MG CAPS Take by mouth.    Marland Kitchen glucosamine-chondroitin 500-400 MG tablet Take 1 tablet by mouth daily.    . hydrochlorothiazide (HYDRODIURIL) 25 MG tablet TAKE 1 TABLET DAILY 90 tablet 1  . levothyroxine (SYNTHROID, LEVOTHROID) 125 MCG tablet TAKE 1 TABLET DAILY BEFORE BREAKFAST 90 tablet 1  . LIVALO 4 MG TABS TAKE 1 TABLET(4 MG) BY MOUTH DAILY. REPEAT LABS ARE DUE NOW 30 tablet 0  . potassium chloride SA (K-DUR,KLOR-CON) 20 MEQ tablet TAKE 2 TABLETS DAILY 180 tablet 1  . traMADol (ULTRAM) 50 MG tablet Take 1 tablet (50 mg total) by mouth every 6 (six) hours as needed. 30 tablet 0  . vitamin B-12 (CYANOCOBALAMIN) 1000 MCG tablet Take 1,000 mcg by mouth daily.     No current facility-administered medications on file prior to visit.    She is allergic to niacin and related and simvastatin..  Review of Systems Review  of Systems  Constitutional: Negative for activity change, appetite change and fatigue.  HENT: Negative for hearing loss, congestion, tinnitus and ear discharge.  dentist-due Eyes: Negative for visual disturbance (see optho q1y -- vision corrected to 20/20 with glasses).  Respiratory: Negative for cough, chest tightness and shortness of breath.   Cardiovascular: Negative for chest pain, palpitations and leg swelling.  Gastrointestinal: Negative for abdominal pain, diarrhea, constipation and  abdominal distention.  Genitourinary: Negative for urgency, frequency, decreased urine volume and difficulty urinating.  Musculoskeletal: Negative for back pain, arthralgias and gait problem.  Skin: Negative for color change, pallor and rash.  Neurological: Negative for dizziness, light-headedness, numbness and headaches.  Hematological: Negative for adenopathy. Does not bruise/bleed easily.  Psychiatric/Behavioral: Negative for suicidal ideas, confusion, sleep disturbance, self-injury, dysphoric mood, decreased concentration and agitation.       Objective:    BP 130/88 (BP Location: Left Arm, Patient Position: Sitting, Cuff Size: Normal)   Pulse 80   Temp 98 F (36.7 C) (Oral)   Resp 16   Ht 5' 3.5" (1.613 m)   SpO2 98%  General appearance: alert, cooperative, appears stated age and no distress Head: Normocephalic, without obvious abnormality, atraumatic Eyes: conjunctivae/corneas clear. PERRL, EOM's intact. Fundi benign. Ears: normal TM's and external ear canals both ears Nose: Nares normal. Septum midline. Mucosa normal. No drainage or sinus tenderness. Throat: lips, mucosa, and tongue normal; teeth and gums normal Neck: no adenopathy, no carotid bruit, no JVD, supple, symmetrical, trachea midline and thyroid not enlarged, symmetric, no tenderness/mass/nodules Back: symmetric, no curvature. ROM normal. No CVA tenderness. Lungs: clear to auscultation bilaterally Breasts: normal appearance, no masses or tenderness Heart: regular rate and rhythm, S1, S2 normal, no murmur, click, rub or gallop Abdomen: soft, non-tender; bowel sounds normal; no masses,  no organomegaly Pelvic: deferred Extremities: extremities normal, atraumatic, no cyanosis or edema Pulses: 2+ and symmetric Skin: Skin color, texture, turgor normal. No rashes or lesions Lymph nodes: Cervical, supraclavicular, and axillary nodes normal. Neurologic: Alert and oriented X 3, normal strength and tone. Normal symmetric  reflexes. Normal coordination and gait    Assessment:    Healthy female exam.      Plan:    ghm utd Check labs See After Visit Summary for Counseling Recommendations    1. Preventative health care See Above - Comprehensive metabolic panel - Lipid panel - CBC with Differential/Platelet - POCT urinalysis dipstick - TSH  2. Vitamin B 12 deficiency Check labs - Vitamin B12  3. Hypothyroidism, unspecified type con't synthroid - TSH  4. Hyperlipidemia, unspecified hyperlipidemia type Check labs

## 2016-03-20 NOTE — Progress Notes (Signed)
Pre visit review using our clinic review tool, if applicable. No additional management support is needed unless otherwise documented below in the visit note. 

## 2016-03-20 NOTE — Patient Instructions (Addendum)
Preventive Care for Adults, Female A healthy lifestyle and preventive care can promote health and wellness. Preventive health guidelines for women include the following key practices.  A routine yearly physical is a good way to check with your health care provider about your health and preventive screening. It is a chance to share any concerns and updates on your health and to receive a thorough exam.  Visit your dentist for a routine exam and preventive care every 6 months. Brush your teeth twice a day and floss once a day. Good oral hygiene prevents tooth decay and gum disease.  The frequency of eye exams is based on your age, health, family medical history, use of contact lenses, and other factors. Follow your health care provider's recommendations for frequency of eye exams.  Eat a healthy diet. Foods like vegetables, fruits, whole grains, low-fat dairy products, and lean protein foods contain the nutrients you need without too many calories. Decrease your intake of foods high in solid fats, added sugars, and salt. Eat the right amount of calories for you.Get information about a proper diet from your health care provider, if necessary.  Regular physical exercise is one of the most important things you can do for your health. Most adults should get at least 150 minutes of moderate-intensity exercise (any activity that increases your heart rate and causes you to sweat) each week. In addition, most adults need muscle-strengthening exercises on 2 or more days a week.  Maintain a healthy weight. The body mass index (BMI) is a screening tool to identify possible weight problems. It provides an estimate of body fat based on height and weight. Your health care provider can find your BMI and can help you achieve or maintain a healthy weight.For adults 20 years and older:  A BMI below 18.5 is considered underweight.  A BMI of 18.5 to 24.9 is normal.  A BMI of 25 to 29.9 is considered overweight.  A  BMI of 30 and above is considered obese.  Maintain normal blood lipids and cholesterol levels by exercising and minimizing your intake of saturated fat. Eat a balanced diet with plenty of fruit and vegetables. Blood tests for lipids and cholesterol should begin at age 45 and be repeated every 5 years. If your lipid or cholesterol levels are high, you are over 50, or you are at high risk for heart disease, you may need your cholesterol levels checked more frequently.Ongoing high lipid and cholesterol levels should be treated with medicines if diet and exercise are not working.  If you smoke, find out from your health care provider how to quit. If you do not use tobacco, do not start.  Lung cancer screening is recommended for adults aged 45-80 years who are at high risk for developing lung cancer because of a history of smoking. A yearly low-dose CT scan of the lungs is recommended for people who have at least a 30-pack-year history of smoking and are a current smoker or have quit within the past 15 years. A pack year of smoking is smoking an average of 1 pack of cigarettes a day for 1 year (for example: 1 pack a day for 30 years or 2 packs a day for 15 years). Yearly screening should continue until the smoker has stopped smoking for at least 15 years. Yearly screening should be stopped for people who develop a health problem that would prevent them from having lung cancer treatment.  If you are pregnant, do not drink alcohol. If you are  breastfeeding, be very cautious about drinking alcohol. If you are not pregnant and choose to drink alcohol, do not have more than 1 drink per day. One drink is considered to be 12 ounces (355 mL) of beer, 5 ounces (148 mL) of wine, or 1.5 ounces (44 mL) of liquor.  Avoid use of street drugs. Do not share needles with anyone. Ask for help if you need support or instructions about stopping the use of drugs.  High blood pressure causes heart disease and increases the risk  of stroke. Your blood pressure should be checked at least every 1 to 2 years. Ongoing high blood pressure should be treated with medicines if weight loss and exercise do not work.  If you are 55-79 years old, ask your health care provider if you should take aspirin to prevent strokes.  Diabetes screening is done by taking a blood sample to check your blood glucose level after you have not eaten for a certain period of time (fasting). If you are not overweight and you do not have risk factors for diabetes, you should be screened once every 3 years starting at age 45. If you are overweight or obese and you are 40-70 years of age, you should be screened for diabetes every year as part of your cardiovascular risk assessment.  Breast cancer screening is essential preventive care for women. You should practice "breast self-awareness." This means understanding the normal appearance and feel of your breasts and may include breast self-examination. Any changes detected, no matter how small, should be reported to a health care provider. Women in their 20s and 30s should have a clinical breast exam (CBE) by a health care provider as part of a regular health exam every 1 to 3 years. After age 40, women should have a CBE every year. Starting at age 40, women should consider having a mammogram (breast X-ray test) every year. Women who have a family history of breast cancer should talk to their health care provider about genetic screening. Women at a high risk of breast cancer should talk to their health care providers about having an MRI and a mammogram every year.  Breast cancer gene (BRCA)-related cancer risk assessment is recommended for women who have family members with BRCA-related cancers. BRCA-related cancers include breast, ovarian, tubal, and peritoneal cancers. Having family members with these cancers may be associated with an increased risk for harmful changes (mutations) in the breast cancer genes BRCA1 and  BRCA2. Results of the assessment will determine the need for genetic counseling and BRCA1 and BRCA2 testing.  Your health care provider may recommend that you be screened regularly for cancer of the pelvic organs (ovaries, uterus, and vagina). This screening involves a pelvic examination, including checking for microscopic changes to the surface of your cervix (Pap test). You may be encouraged to have this screening done every 3 years, beginning at age 21.  For women ages 30-65, health care providers may recommend pelvic exams and Pap testing every 3 years, or they may recommend the Pap and pelvic exam, combined with testing for human papilloma virus (HPV), every 5 years. Some types of HPV increase your risk of cervical cancer. Testing for HPV may also be done on women of any age with unclear Pap test results.  Other health care providers may not recommend any screening for nonpregnant women who are considered low risk for pelvic cancer and who do not have symptoms. Ask your health care provider if a screening pelvic exam is right for   you.  If you have had past treatment for cervical cancer or a condition that could lead to cancer, you need Pap tests and screening for cancer for at least 20 years after your treatment. If Pap tests have been discontinued, your risk factors (such as having a new sexual partner) need to be reassessed to determine if screening should resume. Some women have medical problems that increase the chance of getting cervical cancer. In these cases, your health care provider may recommend more frequent screening and Pap tests.  Colorectal cancer can be detected and often prevented. Most routine colorectal cancer screening begins at the age of 50 years and continues through age 75 years. However, your health care provider may recommend screening at an earlier age if you have risk factors for colon cancer. On a yearly basis, your health care provider may provide home test kits to check  for hidden blood in the stool. Use of a small camera at the end of a tube, to directly examine the colon (sigmoidoscopy or colonoscopy), can detect the earliest forms of colorectal cancer. Talk to your health care provider about this at age 50, when routine screening begins. Direct exam of the colon should be repeated every 5-10 years through age 75 years, unless early forms of precancerous polyps or small growths are found.  People who are at an increased risk for hepatitis B should be screened for this virus. You are considered at high risk for hepatitis B if:  You were born in a country where hepatitis B occurs often. Talk with your health care provider about which countries are considered high risk.  Your parents were born in a high-risk country and you have not received a shot to protect against hepatitis B (hepatitis B vaccine).  You have HIV or AIDS.  You use needles to inject street drugs.  You live with, or have sex with, someone who has hepatitis B.  You get hemodialysis treatment.  You take certain medicines for conditions like cancer, organ transplantation, and autoimmune conditions.  Hepatitis C blood testing is recommended for all people born from 1945 through 1965 and any individual with known risks for hepatitis C.  Practice safe sex. Use condoms and avoid high-risk sexual practices to reduce the spread of sexually transmitted infections (STIs). STIs include gonorrhea, chlamydia, syphilis, trichomonas, herpes, HPV, and human immunodeficiency virus (HIV). Herpes, HIV, and HPV are viral illnesses that have no cure. They can result in disability, cancer, and death.  You should be screened for sexually transmitted illnesses (STIs) including gonorrhea and chlamydia if:  You are sexually active and are younger than 24 years.  You are older than 24 years and your health care provider tells you that you are at risk for this type of infection.  Your sexual activity has changed  since you were last screened and you are at an increased risk for chlamydia or gonorrhea. Ask your health care provider if you are at risk.  If you are at risk of being infected with HIV, it is recommended that you take a prescription medicine daily to prevent HIV infection. This is called preexposure prophylaxis (PrEP). You are considered at risk if:  You are sexually active and do not regularly use condoms or know the HIV status of your partner(s).  You take drugs by injection.  You are sexually active with a partner who has HIV.  Talk with your health care provider about whether you are at high risk of being infected with HIV. If   you choose to begin PrEP, you should first be tested for HIV. You should then be tested every 3 months for as long as you are taking PrEP.  Osteoporosis is a disease in which the bones lose minerals and strength with aging. This can result in serious bone fractures or breaks. The risk of osteoporosis can be identified using a bone density scan. Women ages 67 years and over and women at risk for fractures or osteoporosis should discuss screening with their health care providers. Ask your health care provider whether you should take a calcium supplement or vitamin D to reduce the rate of osteoporosis.  Menopause can be associated with physical symptoms and risks. Hormone replacement therapy is available to decrease symptoms and risks. You should talk to your health care provider about whether hormone replacement therapy is right for you.  Use sunscreen. Apply sunscreen liberally and repeatedly throughout the day. You should seek shade when your shadow is shorter than you. Protect yourself by wearing long sleeves, pants, a wide-brimmed hat, and sunglasses year round, whenever you are outdoors.  Once a month, do a whole body skin exam, using a mirror to look at the skin on your back. Tell your health care provider of new moles, moles that have irregular borders, moles that  are larger than a pencil eraser, or moles that have changed in shape or color.  Stay current with required vaccines (immunizations).  Influenza vaccine. All adults should be immunized every year.  Tetanus, diphtheria, and acellular pertussis (Td, Tdap) vaccine. Pregnant women should receive 1 dose of Tdap vaccine during each pregnancy. The dose should be obtained regardless of the length of time since the last dose. Immunization is preferred during the 27th-36th week of gestation. An adult who has not previously received Tdap or who does not know her vaccine status should receive 1 dose of Tdap. This initial dose should be followed by tetanus and diphtheria toxoids (Td) booster doses every 10 years. Adults with an unknown or incomplete history of completing a 3-dose immunization series with Td-containing vaccines should begin or complete a primary immunization series including a Tdap dose. Adults should receive a Td booster every 10 years.  Varicella vaccine. An adult without evidence of immunity to varicella should receive 2 doses or a second dose if she has previously received 1 dose. Pregnant females who do not have evidence of immunity should receive the first dose after pregnancy. This first dose should be obtained before leaving the health care facility. The second dose should be obtained 4-8 weeks after the first dose.  Human papillomavirus (HPV) vaccine. Females aged 13-26 years who have not received the vaccine previously should obtain the 3-dose series. The vaccine is not recommended for use in pregnant females. However, pregnancy testing is not needed before receiving a dose. If a female is found to be pregnant after receiving a dose, no treatment is needed. In that case, the remaining doses should be delayed until after the pregnancy. Immunization is recommended for any person with an immunocompromised condition through the age of 61 years if she did not get any or all doses earlier. During the  3-dose series, the second dose should be obtained 4-8 weeks after the first dose. The third dose should be obtained 24 weeks after the first dose and 16 weeks after the second dose.  Zoster vaccine. One dose is recommended for adults aged 30 years or older unless certain conditions are present.  Measles, mumps, and rubella (MMR) vaccine. Adults born  before 1957 generally are considered immune to measles and mumps. Adults born in 1957 or later should have 1 or more doses of MMR vaccine unless there is a contraindication to the vaccine or there is laboratory evidence of immunity to each of the three diseases. A routine second dose of MMR vaccine should be obtained at least 28 days after the first dose for students attending postsecondary schools, health care workers, or international travelers. People who received inactivated measles vaccine or an unknown type of measles vaccine during 1963-1967 should receive 2 doses of MMR vaccine. People who received inactivated mumps vaccine or an unknown type of mumps vaccine before 1979 and are at high risk for mumps infection should consider immunization with 2 doses of MMR vaccine. For females of childbearing age, rubella immunity should be determined. If there is no evidence of immunity, females who are not pregnant should be vaccinated. If there is no evidence of immunity, females who are pregnant should delay immunization until after pregnancy. Unvaccinated health care workers born before 1957 who lack laboratory evidence of measles, mumps, or rubella immunity or laboratory confirmation of disease should consider measles and mumps immunization with 2 doses of MMR vaccine or rubella immunization with 1 dose of MMR vaccine.  Pneumococcal 13-valent conjugate (PCV13) vaccine. When indicated, a person who is uncertain of his immunization history and has no record of immunization should receive the PCV13 vaccine. All adults 65 years of age and older should receive this  vaccine. An adult aged 19 years or older who has certain medical conditions and has not been previously immunized should receive 1 dose of PCV13 vaccine. This PCV13 should be followed with a dose of pneumococcal polysaccharide (PPSV23) vaccine. Adults who are at high risk for pneumococcal disease should obtain the PPSV23 vaccine at least 8 weeks after the dose of PCV13 vaccine. Adults older than 52 years of age who have normal immune system function should obtain the PPSV23 vaccine dose at least 1 year after the dose of PCV13 vaccine.  Pneumococcal polysaccharide (PPSV23) vaccine. When PCV13 is also indicated, PCV13 should be obtained first. All adults aged 65 years and older should be immunized. An adult younger than age 65 years who has certain medical conditions should be immunized. Any person who resides in a nursing home or long-term care facility should be immunized. An adult smoker should be immunized. People with an immunocompromised condition and certain other conditions should receive both PCV13 and PPSV23 vaccines. People with human immunodeficiency virus (HIV) infection should be immunized as soon as possible after diagnosis. Immunization during chemotherapy or radiation therapy should be avoided. Routine use of PPSV23 vaccine is not recommended for American Indians, [place] Natives, or people younger than 65 years unless there are medical conditions that require PPSV23 vaccine. When indicated, people who have unknown immunization and have no record of immunization should receive PPSV23 vaccine. One-time revaccination 5 years after the first dose of PPSV23 is recommended for people aged 19-64 years who have chronic kidney failure, nephrotic syndrome, asplenia, or immunocompromised conditions. People who received 1-2 doses of PPSV23 before age 65 years should receive another dose of PPSV23 vaccine at age 65 years or later if at least 5 years have passed since the previous dose. Doses of PPSV23 are not  needed for people immunized with PPSV23 at or after age 65 years.  Meningococcal vaccine. Adults with asplenia or persistent complement component deficiencies should receive 2 doses of quadrivalent meningococcal conjugate (MenACWY-D) vaccine. The doses should be obtained   at least 2 months apart. Microbiologists working with certain meningococcal bacteria, [place] recruits, people at risk during an outbreak, and people who travel to or live in countries with a high rate of meningitis should be immunized. A first-year college student up through age 34 years who is living in a residence hall should receive a dose if she did not receive a dose on or after her 16th birthday. Adults who have certain high-risk conditions should receive one or more doses of vaccine.  Hepatitis A vaccine. Adults who wish to be protected from this disease, have certain high-risk conditions, work with hepatitis A-infected animals, work in hepatitis A research labs, or travel to or work in countries with a high rate of hepatitis A should be immunized. Adults who were previously unvaccinated and who anticipate close contact with an international adoptee during the first 60 days after arrival in the [place] from a country with a high rate of hepatitis A should be immunized.  Hepatitis B vaccine. Adults who wish to be protected from this disease, have certain high-risk conditions, may be exposed to blood or other infectious body fluids, are household contacts or sex partners of hepatitis B positive people, are clients or workers in certain care facilities, or travel to or work in countries with a high rate of hepatitis B should be immunized.  Haemophilus influenzae type b (Hib) vaccine. A previously unvaccinated person with asplenia or sickle cell disease or having a scheduled splenectomy should receive 1 dose of Hib vaccine. Regardless of previous immunization, a recipient of a hematopoietic stem cell transplant should receive a  3-dose series 6-12 months after her successful transplant. Hib vaccine is not recommended for adults with HIV infection. Preventive Services / Frequency Ages 35 to 4 years  Blood pressure check.** / Every 3-5 years.  Lipid and cholesterol check.** / Every 5 years beginning at age 60.  Clinical breast exam.** / Every 3 years for women in their 71s and 10s.  BRCA-related cancer risk assessment.** / For women who have family members with a BRCA-related cancer (breast, ovarian, tubal, or peritoneal cancers).  Pap test.** / Every 2 years from ages 76 through 26. Every 3 years starting at age 61 through age 76 or 93 with a history of 3 consecutive normal Pap tests.  HPV screening.** / Every 3 years from ages 37 through ages 60 to 51 with a history of 3 consecutive normal Pap tests.  Hepatitis C blood test.** / For any individual with known risks for hepatitis C.  Skin self-exam. / Monthly.  Influenza vaccine. / Every year.  Tetanus, diphtheria, and acellular pertussis (Tdap, Td) vaccine.** / Consult your health care provider. Pregnant women should receive 1 dose of Tdap vaccine during each pregnancy. 1 dose of Td every 10 years.  Varicella vaccine.** / Consult your health care provider. Pregnant females who do not have evidence of immunity should receive the first dose after pregnancy.  HPV vaccine. / 3 doses over 6 months, if 93 and younger. The vaccine is not recommended for use in pregnant females. However, pregnancy testing is not needed before receiving a dose.  Measles, mumps, rubella (MMR) vaccine.** / You need at least 1 dose of MMR if you were born in 1957 or later. You may also need a 2nd dose. For females of childbearing age, rubella immunity should be determined. If there is no evidence of immunity, females who are not pregnant should be vaccinated. If there is no evidence of immunity, females who are  pregnant should delay immunization until after pregnancy.  Pneumococcal  13-valent conjugate (PCV13) vaccine.** / Consult your health care provider.  Pneumococcal polysaccharide (PPSV23) vaccine.** / 1 to 2 doses if you smoke cigarettes or if you have certain conditions.  Meningococcal vaccine.** / 1 dose if you are age 68 to 8 years and a Market researcher living in a residence hall, or have one of several medical conditions, you need to get vaccinated against meningococcal disease. You may also need additional booster doses.  Hepatitis A vaccine.** / Consult your health care provider.  Hepatitis B vaccine.** / Consult your health care provider.  Haemophilus influenzae type b (Hib) vaccine.** / Consult your health care provider. Ages 7 to 53 years  Blood pressure check.** / Every year.  Lipid and cholesterol check.** / Every 5 years beginning at age 25 years.  Lung cancer screening. / Every year if you are aged 11-80 years and have a 30-pack-year history of smoking and currently smoke or have quit within the past 15 years. Yearly screening is stopped once you have quit smoking for at least 15 years or develop a health problem that would prevent you from having lung cancer treatment.  Clinical breast exam.** / Every year after age 48 years.  BRCA-related cancer risk assessment.** / For women who have family members with a BRCA-related cancer (breast, ovarian, tubal, or peritoneal cancers).  Mammogram.** / Every year beginning at age 41 years and continuing for as long as you are in good health. Consult with your health care provider.  Pap test.** / Every 3 years starting at age 65 years through age 37 or 70 years with a history of 3 consecutive normal Pap tests.  HPV screening.** / Every 3 years from ages 72 years through ages 60 to 40 years with a history of 3 consecutive normal Pap tests.  Fecal occult blood test (FOBT) of stool. / Every year beginning at age 21 years and continuing until age 5 years. You may not need to do this test if you get  a colonoscopy every 10 years.  Flexible sigmoidoscopy or colonoscopy.** / Every 5 years for a flexible sigmoidoscopy or every 10 years for a colonoscopy beginning at age 35 years and continuing until age 48 years.  Hepatitis C blood test.** / For all people born from [birth year] through 1965 and any individual with known risks for hepatitis C.  Skin self-exam. / Monthly.  Influenza vaccine. / Every year.  Tetanus, diphtheria, and acellular pertussis (Tdap/Td) vaccine.** / Consult your health care provider. Pregnant women should receive 1 dose of Tdap vaccine during each pregnancy. 1 dose of Td every 10 years.  Varicella vaccine.** / Consult your health care provider. Pregnant females who do not have evidence of immunity should receive the first dose after pregnancy.  Zoster vaccine.** / 1 dose for adults aged 30 years or older.  Measles, mumps, rubella (MMR) vaccine.** / You need at least 1 dose of MMR if you were born in 1957 or later. You may also need a second dose. For females of childbearing age, rubella immunity should be determined. If there is no evidence of immunity, females who are not pregnant should be vaccinated. If there is no evidence of immunity, females who are pregnant should delay immunization until after pregnancy.  Pneumococcal 13-valent conjugate (PCV13) vaccine.** / Consult your health care provider.  Pneumococcal polysaccharide (PPSV23) vaccine.** / 1 to 2 doses if you smoke cigarettes or if you have certain conditions.  Meningococcal vaccine.** /  Consult your health care provider.  Hepatitis A vaccine.** / Consult your health care provider.  Hepatitis B vaccine.** / Consult your health care provider.  Haemophilus influenzae type b (Hib) vaccine.** / Consult your health care provider. Ages 65 years and over  Blood pressure check.** / Every year.  Lipid and cholesterol check.** / Every 5 years beginning at age 20 years.  Lung cancer screening. / Every year if you  are aged 55-80 years and have a 30-pack-year history of smoking and currently smoke or have quit within the past 15 years. Yearly screening is stopped once you have quit smoking for at least 15 years or develop a health problem that would prevent you from having lung cancer treatment.  Clinical breast exam.** / Every year after age 40 years.  BRCA-related cancer risk assessment.** / For women who have family members with a BRCA-related cancer (breast, ovarian, tubal, or peritoneal cancers).  Mammogram.** / Every year beginning at age 40 years and continuing for as long as you are in good health. Consult with your health care provider.  Pap test.** / Every 3 years starting at age 30 years through age 65 or 70 years with 3 consecutive normal Pap tests. Testing can be stopped between 65 and 70 years with 3 consecutive normal Pap tests and no abnormal Pap or HPV tests in the past 10 years.  HPV screening.** / Every 3 years from ages 30 years through ages 65 or 70 years with a history of 3 consecutive normal Pap tests. Testing can be stopped between 65 and 70 years with 3 consecutive normal Pap tests and no abnormal Pap or HPV tests in the past 10 years.  Fecal occult blood test (FOBT) of stool. / Every year beginning at age 50 years and continuing until age 75 years. You may not need to do this test if you get a colonoscopy every 10 years.  Flexible sigmoidoscopy or colonoscopy.** / Every 5 years for a flexible sigmoidoscopy or every 10 years for a colonoscopy beginning at age 50 years and continuing until age 75 years.  Hepatitis C blood test.** / For all people born from 1945 through 1965 and any individual with known risks for hepatitis C.  Osteoporosis screening.** / A one-time screening for women ages 65 years and over and women at risk for fractures or osteoporosis.  Skin self-exam. / Monthly.  Influenza vaccine. / Every year.  Tetanus, diphtheria, and acellular pertussis (Tdap/Td)  vaccine.** / 1 dose of Td every 10 years.  Varicella vaccine.** / Consult your health care provider.  Zoster vaccine.** / 1 dose for adults aged 60 years or older.  Pneumococcal 13-valent conjugate (PCV13) vaccine.** / Consult your health care provider.  Pneumococcal polysaccharide (PPSV23) vaccine.** / 1 dose for all adults aged 65 years and older.  Meningococcal vaccine.** / Consult your health care provider.  Hepatitis A vaccine.** / Consult your health care provider.  Hepatitis B vaccine.** / Consult your health care provider.  Haemophilus influenzae type b (Hib) vaccine.** / Consult your health care provider. ** Family history and personal history of risk and conditions may change your health care provider's recommendations.   This information is not intended to replace advice given to you by your health care provider. Make sure you discuss any questions you have with your health care provider.   Document Released: 07/04/2001 Document Revised: 05/29/2014 Document Reviewed: 10/03/2010 Elsevier Interactive Patient Education 2016 Elsevier Inc. DASH Eating Plan DASH stands for "Dietary Approaches to Stop Hypertension." The   DASH eating plan is a healthy eating plan that has been shown to reduce high blood pressure (hypertension). Additional health benefits may include reducing the risk of type 2 diabetes mellitus, heart disease, and stroke. The DASH eating plan may also help with weight loss. WHAT DO I NEED TO KNOW ABOUT THE DASH EATING PLAN? For the DASH eating plan, you will follow these general guidelines:  Choose foods with a percent daily value for sodium of less than 5% (as listed on the food label).  Use salt-free seasonings or herbs instead of table salt or sea salt.  Check with your health care provider or pharmacist before using salt substitutes.  Eat lower-sodium products, often labeled as "lower sodium" or "no salt added."  Eat fresh foods.  Eat more vegetables,  fruits, and low-fat dairy products.  Choose whole grains. Look for the word "whole" as the first word in the ingredient list.  Choose fish and skinless chicken or turkey more often than red meat. Limit fish, poultry, and meat to 6 oz (170 g) each day.  Limit sweets, desserts, sugars, and sugary drinks.  Choose heart-healthy fats.  Limit cheese to 1 oz (28 g) per day.  Eat more home-cooked food and less restaurant, buffet, and fast food.  Limit fried foods.  Cook foods using methods other than frying.  Limit canned vegetables. If you do use them, rinse them well to decrease the sodium.  When eating at a restaurant, ask that your food be prepared with less salt, or no salt if possible. WHAT FOODS CAN I EAT? Seek help from a dietitian for individual calorie needs. Grains Whole grain or whole wheat bread. Brown rice. Whole grain or whole wheat pasta. Quinoa, bulgur, and whole grain cereals. Low-sodium cereals. Corn or whole wheat flour tortillas. Whole grain cornbread. Whole grain crackers. Low-sodium crackers. Vegetables Fresh or frozen vegetables (raw, steamed, roasted, or grilled). Low-sodium or reduced-sodium tomato and vegetable juices. Low-sodium or reduced-sodium tomato sauce and paste. Low-sodium or reduced-sodium canned vegetables.  Fruits All fresh, canned (in natural juice), or frozen fruits. Meat and Other Protein Products Ground beef (85% or leaner), grass-fed beef, or beef trimmed of fat. Skinless chicken or turkey. Ground chicken or turkey. Pork trimmed of fat. All fish and seafood. Eggs. Dried beans, peas, or lentils. Unsalted nuts and seeds. Unsalted canned beans. Dairy Low-fat dairy products, such as skim or 1% milk, 2% or reduced-fat cheeses, low-fat ricotta or cottage cheese, or plain low-fat yogurt. Low-sodium or reduced-sodium cheeses. Fats and Oils Tub margarines without trans fats. Light or reduced-fat mayonnaise and salad dressings (reduced sodium). Avocado.  Safflower, olive, or canola oils. Natural peanut or almond butter. Other Unsalted popcorn and pretzels. The items listed above may not be a complete list of recommended foods or beverages. Contact your dietitian for more options. WHAT FOODS ARE NOT RECOMMENDED? Grains White bread. White pasta. White rice. Refined cornbread. Bagels and croissants. Crackers that contain trans fat. Vegetables Creamed or fried vegetables. Vegetables in a cheese sauce. Regular canned vegetables. Regular canned tomato sauce and paste. Regular tomato and vegetable juices. Fruits Dried fruits. Canned fruit in light or heavy syrup. Fruit juice. Meat and Other Protein Products Fatty cuts of meat. Ribs, chicken wings, bacon, sausage, bologna, salami, chitterlings, fatback, hot dogs, bratwurst, and packaged luncheon meats. Salted nuts and seeds. Canned beans with salt. Dairy Whole or 2% milk, cream, half-and-half, and cream cheese. Whole-fat or sweetened yogurt. Full-fat cheeses or blue cheese. Nondairy creamers and whipped toppings. Processed cheese,   Processed cheese, cheese spreads, or cheese curds. Condiments Onion and garlic salt, seasoned salt, table salt, and sea salt. Canned and packaged gravies. Worcestershire sauce. Tartar sauce. Barbecue sauce. Teriyaki sauce. Soy sauce, including reduced sodium. Steak sauce. Fish sauce. Oyster sauce. Cocktail sauce. Horseradish. Ketchup and mustard. Meat flavorings and tenderizers. Bouillon cubes. Hot sauce. Tabasco sauce. Marinades. Taco seasonings. Relishes. Fats and Oils Butter, stick margarine, lard, shortening, ghee, and bacon fat. Coconut, palm kernel, or palm oils. Regular salad dressings. Other Pickles and olives. Salted popcorn and pretzels. The items listed above may not be a complete list of foods and beverages to avoid. Contact your dietitian for more information. WHERE CAN I FIND MORE INFORMATION? National Heart, Lung, and Blood Institute:  travelstabloid.com   This information is not intended to replace advice given to you by your health care provider. Make sure you discuss any questions you have with your health care provider.   Document Released: 04/27/2011 Document Revised: 05/29/2014 Document Reviewed: 03/12/2013 Elsevier Interactive Patient Education [employer].

## 2016-03-20 NOTE — Assessment & Plan Note (Signed)
con't livalo Check labs

## 2016-04-01 ENCOUNTER — Other Ambulatory Visit: Payer: Self-pay | Admitting: Family Medicine

## 2016-04-09 ENCOUNTER — Encounter: Payer: Self-pay | Admitting: Family Medicine

## 2016-04-10 MED ORDER — TRAMADOL HCL 50 MG PO TABS: 50.0000 mg | ORAL_TABLET | Freq: Four times a day (QID) | ORAL | 0 refills | Status: DC | PRN

## 2016-04-10 NOTE — Telephone Encounter (Signed)
Last OV: 03/20/16 Next OV: due in 6 months. Last RF: 05/02/16, #30  Rx printed and forwarded to PCP for signature.

## 2016-06-09 ENCOUNTER — Telehealth: Payer: Self-pay

## 2016-06-09 NOTE — Telephone Encounter (Signed)
PA initiated via Covermymeds; KEY: [id number]. Received real-time approval. Livalo approved through 06/09/2017.

## 2016-06-22 ENCOUNTER — Other Ambulatory Visit: Payer: Self-pay | Admitting: Family Medicine

## 2016-08-24 ENCOUNTER — Encounter: Payer: Self-pay | Admitting: Family Medicine

## 2016-08-24 MED ORDER — TRAMADOL HCL 50 MG PO TABS: 50.0000 mg | ORAL_TABLET | Freq: Four times a day (QID) | ORAL | 0 refills | Status: DC | PRN

## 2016-12-20 ENCOUNTER — Other Ambulatory Visit: Payer: Self-pay | Admitting: Family Medicine

## 2017-01-31 ENCOUNTER — Encounter: Payer: Self-pay | Admitting: Family Medicine

## 2017-01-31 DIAGNOSIS — Z1231 Encounter for screening mammogram for malignant neoplasm of breast: Secondary | ICD-10-CM

## 2017-02-24 ENCOUNTER — Other Ambulatory Visit: Payer: Self-pay | Admitting: Family Medicine

## 2017-02-26 NOTE — Telephone Encounter (Signed)
This is not on current med list/thx dmf 

## 2017-02-28 ENCOUNTER — Telehealth: Payer: Self-pay | Admitting: *Deleted

## 2017-02-28 NOTE — Telephone Encounter (Signed)
Received Normal results from mammogram; forwarded to provider/SLS 10/10

## 2017-03-23 ENCOUNTER — Ambulatory Visit (INDEPENDENT_AMBULATORY_CARE_PROVIDER_SITE_OTHER): Payer: BLUE CROSS/BLUE SHIELD | Admitting: Family Medicine

## 2017-03-23 ENCOUNTER — Ambulatory Visit (HOSPITAL_BASED_OUTPATIENT_CLINIC_OR_DEPARTMENT_OTHER)
Admission: RE | Admit: 2017-03-23 | Discharge: 2017-03-23 | Disposition: A | Discharge status: Home / Self Care | Payer: BLUE CROSS/BLUE SHIELD | Source: Ambulatory Visit | Attending: Family Medicine | Admitting: Family Medicine

## 2017-03-23 ENCOUNTER — Encounter: Payer: Self-pay | Admitting: Family Medicine

## 2017-03-23 VITALS — BP 146/90 | HR 95 | Temp 97.5°F | Resp 18 | Ht 63.0 in | Wt 216.8 lb

## 2017-03-23 DIAGNOSIS — J40 Bronchitis, not specified as acute or chronic: Secondary | ICD-10-CM

## 2017-03-23 DIAGNOSIS — E785 Hyperlipidemia, unspecified: Secondary | ICD-10-CM | POA: Diagnosis not present

## 2017-03-23 DIAGNOSIS — Z0000 Encounter for general adult medical examination without abnormal findings: Secondary | ICD-10-CM | POA: Diagnosis not present

## 2017-03-23 DIAGNOSIS — R05 Cough: Secondary | ICD-10-CM | POA: Diagnosis present

## 2017-03-23 LAB — COMPREHENSIVE METABOLIC PANEL
AG RATIO: 1.9 (calc) (ref 1.0–2.5)
ALKALINE PHOSPHATASE (APISO): 94 U/L (ref 33–130)
ALT: 29 U/L (ref 6–29)
AST: 19 U/L (ref 10–35)
Albumin: 4.3 g/dL (ref 3.6–5.1)
BUN: 14 mg/dL (ref 7–25)
CALCIUM: 9.3 mg/dL (ref 8.6–10.4)
CO2: 24 mmol/L (ref 20–32)
Chloride: 101 mmol/L (ref 98–110)
Creat: 0.78 mg/dL (ref 0.50–1.05)
GLUCOSE: 85 mg/dL (ref 65–99)
Globulin: 2.3 g/dL (calc) (ref 1.9–3.7)
Potassium: 3.9 mmol/L (ref 3.5–5.3)
Sodium: 140 mmol/L (ref 135–146)
Total Bilirubin: 0.4 mg/dL (ref 0.2–1.2)
Total Protein: 6.6 g/dL (ref 6.1–8.1)

## 2017-03-23 LAB — CBC WITH DIFFERENTIAL/PLATELET
Basophils Absolute: 19 cells/uL (ref 0–200)
Basophils Relative: 0.3 %
EOS PCT: 3.5 %
Eosinophils Absolute: 221 cells/uL (ref 15–500)
HEMATOCRIT: 37.9 % (ref 35.0–45.0)
HEMOGLOBIN: 12.6 g/dL (ref 11.7–15.5)
LYMPHS ABS: 1714 {cells}/uL (ref 850–3900)
MCH: 27 pg (ref 27.0–33.0)
MCHC: 33.2 g/dL (ref 32.0–36.0)
MCV: 81.3 fL (ref 80.0–100.0)
MPV: 11.6 fL (ref 7.5–12.5)
Monocytes Relative: 8.4 %
NEUTROS ABS: 3818 {cells}/uL (ref 1500–7800)
Neutrophils Relative %: 60.6 %
Platelets: 261 10*3/uL (ref 140–400)
RBC: 4.66 10*6/uL (ref 3.80–5.10)
RDW: 13.6 % (ref 11.0–15.0)
Total Lymphocyte: 27.2 %
WBC mixed population: 529 cells/uL (ref 200–950)
WBC: 6.3 10*3/uL (ref 3.8–10.8)

## 2017-03-23 LAB — POC URINALSYSI DIPSTICK (AUTOMATED)
Bilirubin, UA: NEGATIVE
Blood, UA: NEGATIVE
Glucose, UA: NEGATIVE
Ketones, UA: NEGATIVE
LEUKOCYTES UA: NEGATIVE
Nitrite, UA: NEGATIVE
PROTEIN UA: NEGATIVE
Spec Grav, UA: 1.015 (ref 1.010–1.025)
UROBILINOGEN UA: 0.2 U/dL
pH, UA: 6 (ref 5.0–8.0)

## 2017-03-23 LAB — TSH: TSH: 1.92 mIU/L

## 2017-03-23 LAB — LIPID PANEL
CHOL/HDL RATIO: 5.5 (calc) — AB (ref <5.0)
Cholesterol: 309 mg/dL — ABNORMAL HIGH (ref <200)
HDL: 56 mg/dL (ref >50)
LDL Cholesterol (Calc): 218 mg/dL (calc) — ABNORMAL HIGH
NON-HDL CHOLESTEROL (CALC): 253 mg/dL — AB (ref <130)
TRIGLYCERIDES: 181 mg/dL — AB (ref <150)

## 2017-03-23 MED ORDER — AZITHROMYCIN 250 MG PO TABS: ORAL_TABLET | ORAL | 0 refills | Status: DC

## 2017-03-23 MED ORDER — PITAVASTATIN CALCIUM 4 MG PO TABS: 4.0000 mg | ORAL_TABLET | Freq: Every day | ORAL | 5 refills | Status: DC

## 2017-03-23 NOTE — Patient Instructions (Signed)
Preventive Care 40-64 Years, Female Preventive care refers to lifestyle choices and visits with your health care provider that can promote health and wellness. What does preventive care include?  A yearly physical exam. This is also called an annual well check.  Dental exams once or twice a year.  Routine eye exams. Ask your health care provider how often you should have your eyes checked.  Personal lifestyle choices, including: ? Daily care of your teeth and gums. ? Regular physical activity. ? Eating a healthy diet. ? Avoiding tobacco and drug use. ? Limiting alcohol use. ? Practicing safe sex. ? Taking low-dose aspirin daily starting at age 58. ? Taking vitamin and mineral supplements as recommended by your health care provider. What happens during an annual well check? The services and screenings done by your health care provider during your annual well check will depend on your age, overall health, lifestyle risk factors, and family history of disease. Counseling Your health care provider may ask you questions about your:  Alcohol use.  Tobacco use.  Drug use.  Emotional well-being.  Home and relationship well-being.  Sexual activity.  Eating habits.  Work and work Statistician.  Method of birth control.  Menstrual cycle.  Pregnancy history.  Screening You may have the following tests or measurements:  Height, weight, and BMI.  Blood pressure.  Lipid and cholesterol levels. These may be checked every 5 years, or more frequently if you are over 81 years old.  Skin check.  Lung cancer screening. You may have this screening every year starting at age 78 if you have a 30-pack-year history of smoking and currently smoke or have quit within the past 15 years.  Fecal occult blood test (FOBT) of the stool. You may have this test every year starting at age 65.  Flexible sigmoidoscopy or colonoscopy. You may have a sigmoidoscopy every 5 years or a colonoscopy  every 10 years starting at age 30.  Hepatitis C blood test.  Hepatitis B blood test.  Sexually transmitted disease (STD) testing.  Diabetes screening. This is done by checking your blood sugar (glucose) after you have not eaten for a while (fasting). You may have this done every 1-3 years.  Mammogram. This may be done every 1-2 years. Talk to your health care provider about when you should start having regular mammograms. This may depend on whether you have a family history of breast cancer.  BRCA-related cancer screening. This may be done if you have a family history of breast, ovarian, tubal, or peritoneal cancers.  Pelvic exam and Pap test. This may be done every 3 years starting at age 80. Starting at age 36, this may be done every 5 years if you have a Pap test in combination with an HPV test.  Bone density scan. This is done to screen for osteoporosis. You may have this scan if you are at high risk for osteoporosis.  Discuss your test results, treatment options, and if necessary, the need for more tests with your health care provider. Vaccines Your health care provider may recommend certain vaccines, such as:  Influenza vaccine. This is recommended every year.  Tetanus, diphtheria, and acellular pertussis (Tdap, Td) vaccine. You may need a Td booster every 10 years.  Varicella vaccine. You may need this if you have not been vaccinated.  Zoster vaccine. You may need this after age 5.  Measles, mumps, and rubella (MMR) vaccine. You may need at least one dose of MMR if you were born in  1957 or later. You may also need a second dose.  Pneumococcal 13-valent conjugate (PCV13) vaccine. You may need this if you have certain conditions and were not previously vaccinated.  Pneumococcal polysaccharide (PPSV23) vaccine. You may need one or two doses if you smoke cigarettes or if you have certain conditions.  Meningococcal vaccine. You may need this if you have certain  conditions.  Hepatitis A vaccine. You may need this if you have certain conditions or if you travel or work in places where you may be exposed to hepatitis A.  Hepatitis B vaccine. You may need this if you have certain conditions or if you travel or work in places where you may be exposed to hepatitis B.  Haemophilus influenzae type b (Hib) vaccine. You may need this if you have certain conditions.  Talk to your health care provider about which screenings and vaccines you need and how often you need them. This information is not intended to replace advice given to you by your health care provider. Make sure you discuss any questions you have with your health care provider. Document Released: 06/04/2015 Document Revised: 01/26/2016 Document Reviewed: 03/09/2015 Elsevier Interactive Patient Education  2017 [employer].

## 2017-03-23 NOTE — Progress Notes (Signed)
Subjective:     Tracy Mathews is a 51 y.o. female and is here for a comprehensive physical exam. The patient reports problems - cough --- several weeks-- has been to UC several times.  Pt just finished prednisone.  She is still coughing   Social History   Social History  . Marital status: Single    Spouse name: N/A  . Number of children: N/A  . Years of education: N/A   Occupational History  . manager [employer]    Social History Main Topics  . Smoking status: Never Smoker  . Smokeless tobacco: Never Used  . Alcohol use 0.0 oz/week     Comment: occasionally; "a couple times monthly"  . Drug use: No  . Sexual activity: Yes    Partners: Male   Other Topics Concern  . Not on file   Social History Narrative   Exercising--no   Health Maintenance  Topic Date Due  . INFLUENZA VACCINE  12/20/2016  . MAMMOGRAM  02/22/2017  . PAP SMEAR  01/11/2018  . TETANUS/TDAP  11/06/2021  . COLONOSCOPY  03/24/2025  . Hepatitis C Screening  Completed  . HIV Screening  Completed    The following portions of the patient's history were reviewed and updated as appropriate:  She  has a past medical history of Allergy; Arthritis; GERD (gastroesophageal reflux disease); Hemorrhoid; Hives; cardiovascular stress test; Hyperlipidemia; and Hypothyroidism. She  does not have any pertinent problems on file. She  has a past surgical history that includes none; Trigger finger release (Right, 10/18/2012); wisdom teeth removal; and Ganglion cyst excision (Left, 02/17/2016). Her family history includes Arthritis in her father and mother; Diabetes in her brother and mother; Heart attack in her mother; Heart disease in her maternal uncle, maternal uncle, and maternal uncle; Heart disease (age of onset: 64) in her mother; Hyperlipidemia in her sister; Hypertension in her brother, brother, mother, and sister; Pulmonary fibrosis in her maternal uncle; Stroke in her father and mother. She  reports that she has  never smoked. She has never used smokeless tobacco. She reports that she drinks alcohol. She reports that she does not use drugs. She has a current medication list which includes the following prescription(s): azithromycin, cetirizine, coq10, esomeprazole, flax seed oil, glucosamine-chondroitin, hydrochlorothiazide, levothyroxine, pitavastatin calcium, potassium chloride sa, tramadol, and vitamin b-12. Current Outpatient Prescriptions on File Prior to Visit  Medication Sig Dispense Refill  . cetirizine (ZYRTEC) 10 MG tablet Take 10 mg by mouth daily.      . Coenzyme Q10 (COQ10) 200 MG CAPS Take by mouth.    . esomeprazole (NEXIUM) 40 MG capsule TAKE 1 CAPSULE DAILY 90 capsule 1  . Flaxseed, Linseed, (FLAX SEED OIL) 1000 MG CAPS Take by mouth.    Marland Kitchen glucosamine-chondroitin 500-400 MG tablet Take 1 tablet by mouth daily.    . hydrochlorothiazide (HYDRODIURIL) 25 MG tablet TAKE 1 TABLET DAILY 90 tablet 1  . levothyroxine (SYNTHROID, LEVOTHROID) 125 MCG tablet TAKE 1 TABLET DAILY BEFORE BREAKFAST. 90 tablet 1  . potassium chloride SA (K-DUR,KLOR-CON) 20 MEQ tablet TAKE 2 TABLETS DAILY 180 tablet 1  . traMADol (ULTRAM) 50 MG tablet Take 1 tablet (50 mg total) by mouth every 6 (six) hours as needed. 30 tablet 0  . vitamin B-12 (CYANOCOBALAMIN) 1000 MCG tablet Take 1,000 mcg by mouth daily.     No current facility-administered medications on file prior to visit.    She is allergic to bactrim [sulfamethoxazole-trimethoprim]; niacin and related; and simvastatin.Marland Kitchen  Review of Systems Review of Systems  Constitutional: Negative for activity change, appetite change and fatigue.  HENT: Negative for hearing loss, congestion, tinnitus and ear discharge.  dentist [id number]: Negative for visual disturbance (see optho q1y -- vision corrected to 20/20 with glasses).  Respiratory: Negative for  chest tightness and shortness of breath.  + cough Cardiovascular: Negative for chest pain, palpitations and leg swelling.   Gastrointestinal: Negative for abdominal pain, diarrhea, constipation and abdominal distention.  Genitourinary: Negative for urgency, frequency, decreased urine volume and difficulty urinating.  Musculoskeletal: Negative for back pain, arthralgias and gait problem.  Skin: Negative for color change, pallor and rash.  Neurological: Negative for dizziness, light-headedness, numbness and headaches.  Hematological: Negative for adenopathy. Does not bruise/bleed easily.  Psychiatric/Behavioral: Negative for suicidal ideas, confusion, sleep disturbance, self-injury, dysphoric mood, decreased concentration and agitation.       Objective:    BP (!) 146/90 (BP Location: Left Arm, Patient Position: Sitting, Cuff Size: Small)   Pulse 95   Temp (!) 97.5 F (36.4 C) (Oral)   Resp 18   Ht '5\' 3"'$  (1.6 m)   Wt 216 lb 12.8 oz (98.3 kg)   SpO2 93%   BMI 38.40 kg/m  General appearance: alert, cooperative, appears stated age and no distress Head: Normocephalic, without obvious abnormality, atraumatic Eyes: conjunctivae/corneas clear. PERRL, EOM's intact. Fundi benign. Ears: normal TM's and external ear canals both ears Nose: Nares normal. Septum midline. Mucosa normal. No drainage or sinus tenderness. Throat: lips, mucosa, and tongue normal; teeth and gums normal Neck: no adenopathy, no carotid bruit, no JVD, supple, symmetrical, trachea midline and thyroid not enlarged, symmetric, no tenderness/mass/nodules Back: symmetric, no curvature. ROM normal. No CVA tenderness. Lungs: clear to auscultation bilaterally Breasts: normal appearance, no masses or tenderness Heart: regular rate and rhythm, S1, S2 normal, no murmur, click, rub or gallop Abdomen: soft, non-tender; bowel sounds normal; no masses,  no organomegaly Pelvic: deferred--gyn Extremities: extremities normal, atraumatic, no cyanosis or edema Pulses: 2+ and symmetric Skin: Skin color, texture, turgor normal. No rashes or lesions Lymph  nodes: Cervical, supraclavicular, and axillary nodes normal. Neurologic: Alert and oriented X 3, normal strength and tone. Normal symmetric reflexes. Normal coordination and gait    Assessment:    Healthy female exam.      Plan:    ghm utd Check labs See After Visit Summary for Counseling Recommendations    1. Hyperlipidemia LDL goal <100 Tolerating statin, encouraged heart healthy diet, avoid trans fats, minimize simple carbs and saturated fats. Increase exercise as tolerated - Pitavastatin Calcium (LIVALO) 4 MG TABS; Take 1 tablet (4 mg total) by mouth daily.  Dispense: 30 tablet; Refill: 5 - POCT Urinalysis Dipstick (Automated) - CBC w/Diff - Lipid panel - TSH - Comp Met (CMET)  2. Preventative health care See above  - POCT Urinalysis Dipstick (Automated) - CBC w/Diff - Lipid panel - TSH - Comp Met (CMET)  3. Bronchitis New --  See orders, use otc cough meds  - azithromycin (ZITHROMAX Z-PAK) 250 MG tablet; As directed  Dispense: 6 each; Refill: 0 - DG Chest 2 View; Future

## 2017-03-25 ENCOUNTER — Encounter: Payer: Self-pay | Admitting: Family Medicine

## 2017-03-27 ENCOUNTER — Encounter: Payer: Self-pay | Admitting: Family Medicine

## 2017-03-27 DIAGNOSIS — E785 Hyperlipidemia, unspecified: Secondary | ICD-10-CM

## 2017-03-28 MED ORDER — EZETIMIBE 10 MG PO TABS: 10.0000 mg | ORAL_TABLET | Freq: Every day | ORAL | 2 refills | Status: DC

## 2017-03-28 NOTE — Telephone Encounter (Signed)
Notes recorded by Carollee Herter, Alferd Apa, DO on 03/25/2017 at 3:23 PM EST Cholesterol--- LDL goal < 100, HDL >40, TG < 150. Diet and exercise will increase HDL and decrease LDL and TG. Fish, Fish Oil, Flaxseed oil will also help increase the HDL and decrease Triglycerides.  Recheck labs in 3 months con't livalo and add zetia 10 mg #30 1 po qhs, 2 refills  Lipid, cmp.

## 2017-04-04 ENCOUNTER — Encounter: Payer: Self-pay | Admitting: Family Medicine

## 2017-04-05 MED ORDER — AMOXICILLIN-POT CLAVULANATE 875-125 MG PO TABS
1.0000 | ORAL_TABLET | Freq: Two times a day (BID) | ORAL | 0 refills | Status: DC
Start: 2017-04-05 — End: 2017-10-16

## 2017-04-05 NOTE — Telephone Encounter (Signed)
augmentin 875 bid x 7 days  If she thinks she might need pred taper-- we would need to see her again

## 2017-05-28 ENCOUNTER — Telehealth: Payer: Self-pay

## 2017-05-28 NOTE — Telephone Encounter (Signed)
PA initiated via Covermymeds; KEY: [id number]. Received real time PA approval.   AJLUNG:[id number];CNGFRE:[id number];Review Type:Prior Auth;Coverage Start Date:04/28/2017;Coverage End Date:05/28/2018;

## 2017-06-06 ENCOUNTER — Encounter: Payer: Self-pay | Admitting: Family Medicine

## 2017-06-06 DIAGNOSIS — E785 Hyperlipidemia, unspecified: Secondary | ICD-10-CM

## 2017-06-06 MED ORDER — PITAVASTATIN CALCIUM 4 MG PO TABS: 4.0000 mg | ORAL_TABLET | Freq: Every day | ORAL | 0 refills | Status: DC

## 2017-06-21 ENCOUNTER — Other Ambulatory Visit: Payer: Self-pay | Admitting: Family Medicine

## 2017-07-07 ENCOUNTER — Other Ambulatory Visit: Payer: Self-pay | Admitting: Family Medicine

## 2017-08-12 ENCOUNTER — Encounter: Payer: Self-pay | Admitting: Family Medicine

## 2017-08-12 DIAGNOSIS — E785 Hyperlipidemia, unspecified: Secondary | ICD-10-CM

## 2017-08-13 MED ORDER — EZETIMIBE 10 MG PO TABS: 10.0000 mg | ORAL_TABLET | Freq: Every day | ORAL | 3 refills | Status: DC

## 2017-08-13 MED ORDER — PITAVASTATIN CALCIUM 4 MG PO TABS: 4.0000 mg | ORAL_TABLET | Freq: Every day | ORAL | 0 refills | Status: DC

## 2017-08-13 MED ORDER — EZETIMIBE 10 MG PO TABS: 10.0000 mg | ORAL_TABLET | Freq: Every day | ORAL | 0 refills | Status: DC

## 2017-08-13 MED ORDER — PITAVASTATIN CALCIUM 4 MG PO TABS: 4.0000 mg | ORAL_TABLET | Freq: Every day | ORAL | 3 refills | Status: DC

## 2017-10-16 ENCOUNTER — Encounter: Payer: Self-pay | Admitting: Family Medicine

## 2017-10-16 ENCOUNTER — Ambulatory Visit (INDEPENDENT_AMBULATORY_CARE_PROVIDER_SITE_OTHER): Payer: 59 | Admitting: Family Medicine

## 2017-10-16 VITALS — BP 126/80 | HR 78 | Temp 98.0°F | Resp 16 | Ht 63.0 in | Wt 211.4 lb

## 2017-10-16 DIAGNOSIS — E785 Hyperlipidemia, unspecified: Secondary | ICD-10-CM | POA: Diagnosis not present

## 2017-10-16 DIAGNOSIS — E039 Hypothyroidism, unspecified: Secondary | ICD-10-CM

## 2017-10-16 LAB — LIPID PANEL
CHOL/HDL RATIO: 3
Cholesterol: 171 mg/dL (ref 0–200)
HDL: 55.3 mg/dL (ref >39.00)
LDL Cholesterol: 95 mg/dL (ref 0–99)
NONHDL: 115.38
TRIGLYCERIDES: 104 mg/dL (ref 0.0–149.0)
VLDL: 20.8 mg/dL (ref 0.0–40.0)

## 2017-10-16 LAB — COMPREHENSIVE METABOLIC PANEL
ALK PHOS: 88 U/L (ref 39–117)
ALT: 22 U/L (ref 0–35)
AST: 14 U/L (ref 0–37)
Albumin: 4.4 g/dL (ref 3.5–5.2)
BUN: 26 mg/dL — ABNORMAL HIGH (ref 6–23)
CALCIUM: 9.6 mg/dL (ref 8.4–10.5)
CO2: 27 meq/L (ref 19–32)
CREATININE: 0.94 mg/dL (ref 0.40–1.20)
Chloride: 103 mEq/L (ref 96–112)
GFR: 65.87 mL/min (ref >60.00)
Glucose, Bld: 99 mg/dL (ref 70–99)
Potassium: 4.4 mEq/L (ref 3.5–5.1)
Sodium: 139 mEq/L (ref 135–145)
Total Bilirubin: 0.4 mg/dL (ref 0.2–1.2)
Total Protein: 7 g/dL (ref 6.0–8.3)

## 2017-10-16 LAB — TSH: TSH: 3.81 u[IU]/mL (ref 0.35–4.50)

## 2017-10-16 NOTE — Assessment & Plan Note (Signed)
Tolerating statin, encouraged heart healthy diet, avoid trans fats, minimize simple carbs and saturated fats. Increase exercise as tolerated 

## 2017-10-16 NOTE — Patient Instructions (Signed)

## 2017-10-16 NOTE — Progress Notes (Signed)
Patient ID: Tracy Mathews, female    DOB: [birth year]  Age: 52 y.o. MRN: [id number]    Subjective:  Subjective  HPI Tracy Mathews presents for f/u cholesterol and thyroid.  C/o only of hot flashes --that are worsening.    Review of Systems  Constitutional: Negative for appetite change, diaphoresis, fatigue and unexpected weight change.  Eyes: Negative for pain, redness and visual disturbance.  Respiratory: Negative for cough, chest tightness, shortness of breath and wheezing.   Cardiovascular: Negative for chest pain, palpitations and leg swelling.  Endocrine: Negative for cold intolerance, heat intolerance, polydipsia, polyphagia and polyuria.  Genitourinary: Negative for difficulty urinating, dysuria and frequency.  Neurological: Negative for dizziness, light-headedness, numbness and headaches.  Psychiatric/Behavioral: Negative.     History Past Medical History:  Diagnosis Date  . Allergy   . Arthritis   . GERD (gastroesophageal reflux disease)   . Hemorrhoid   . Hives    chronic  . Hx of cardiovascular stress test    ETT-Myoview (9/15):  no ischemia, EF 60%; low risk  . Hyperlipidemia   . Hypothyroidism     She has a past surgical history that includes none; Trigger finger release (Right, 10/18/2012); wisdom teeth removal; and Ganglion cyst excision (Left, 02/17/2016).   Her family history includes Arthritis in her father and mother; Diabetes in her brother and mother; Heart attack in her mother; Heart disease in her maternal uncle, maternal uncle, and maternal uncle; Heart disease (age of onset: 59) in her mother; Hyperlipidemia in her sister; Hypertension in her brother, brother, mother, and sister; Pulmonary fibrosis in her maternal uncle; Stroke in her father and mother.She reports that she has never smoked. She has never used smokeless tobacco. She reports that she drinks alcohol. She reports that she does not use drugs.  Current Outpatient Medications on File Prior to  Visit  Medication Sig Dispense Refill  . cetirizine (ZYRTEC) 10 MG tablet Take 10 mg by mouth daily.      . Coenzyme Q10 (COQ10) 200 MG CAPS Take by mouth.    . esomeprazole (NEXIUM) 40 MG capsule TAKE 1 CAPSULE DAILY 90 capsule 1  . ezetimibe (ZETIA) 10 MG tablet Take 1 tablet (10 mg total) by mouth at bedtime. 90 tablet 0  . Flaxseed, Linseed, (FLAX SEED OIL) 1000 MG CAPS Take by mouth.    Marland Kitchen glucosamine-chondroitin 500-400 MG tablet Take 1 tablet by mouth daily.    . hydrochlorothiazide (HYDRODIURIL) 25 MG tablet TAKE 1 TABLET DAILY 90 tablet 1  . levothyroxine (SYNTHROID, LEVOTHROID) 125 MCG tablet TAKE 1 TABLET DAILY BEFORE BREAKFAST 90 tablet 1  . Pitavastatin Calcium (LIVALO) 4 MG TABS Take 1 tablet (4 mg total) by mouth daily. 90 tablet 0  . potassium chloride SA (K-DUR,KLOR-CON) 20 MEQ tablet TAKE 2 TABLETS DAILY 180 tablet 1  . vitamin B-12 (CYANOCOBALAMIN) 1000 MCG tablet Take 1,000 mcg by mouth daily.    . traMADol (ULTRAM) 50 MG tablet Take 1 tablet (50 mg total) by mouth every 6 (six) hours as needed. (Patient not taking: Reported on 10/16/2017) 30 tablet 0   No current facility-administered medications on file prior to visit.      Objective:  Objective  Physical Exam  Constitutional: She is oriented to person, place, and time. She appears well-developed and well-nourished.  HENT:  Head: Normocephalic and atraumatic.  Eyes: Conjunctivae and EOM are normal.  Neck: Normal range of motion. Neck supple. No JVD present. Carotid bruit is not present. No thyromegaly present.  Cardiovascular: Normal rate, regular rhythm and normal heart sounds.  No murmur heard. Pulmonary/Chest: Effort normal and breath sounds normal. No respiratory distress. She has no wheezes. She has no rales. She exhibits no tenderness.  Musculoskeletal: She exhibits no edema.  Neurological: She is alert and oriented to person, place, and time.  Psychiatric: She has a normal mood and affect.  Nursing note and  vitals reviewed.  BP 126/80 (BP Location: Left Arm, Patient Position: Sitting, Cuff Size: Large)   Pulse 78   Temp 98 F (36.7 C) (Oral)   Resp 16   Ht 5\' 3"  (1.6 m)   Wt 211 lb 6.4 oz (95.9 kg)   SpO2 98%   BMI 37.45 kg/m  Wt Readings from Last 3 Encounters:  10/16/17 211 lb 6.4 oz (95.9 kg)  03/23/17 216 lb 12.8 oz (98.3 kg)  03/20/16 215 lb 9.6 oz (97.8 kg)     Lab Results  Component Value Date   WBC 6.3 03/23/2017   HGB 12.6 03/23/2017   HCT 37.9 03/23/2017   PLT 261 03/23/2017   GLUCOSE 85 03/23/2017   CHOL 309 (H) 03/23/2017   TRIG 181 (H) 03/23/2017   HDL 56 03/23/2017   LDLDIRECT 188.6 01/10/2013   LDLCALC 218 (H) 03/23/2017   ALT 29 03/23/2017   AST 19 03/23/2017   NA 140 03/23/2017   K 3.9 03/23/2017   CL 101 03/23/2017   CREATININE 0.78 03/23/2017   BUN 14 03/23/2017   CO2 24 03/23/2017   TSH 1.92 03/23/2017   HGBA1C 5.8 01/10/2013   MICROALBUR <0.7 01/12/2015    Dg Chest 2 View  Result Date: 03/24/2017 CLINICAL DATA:  Cough for the past 2 months. EXAM: CHEST  2 VIEW COMPARISON:  None. FINDINGS: Normal sized heart. Clear lungs. Poor inspiration. Minimal diffuse peribronchial thickening. Mild thoracic spine degenerative changes. IMPRESSION: Minimal bronchitic changes. Electronically Signed   By: Claudie Revering M.D.   On: 03/24/2017 10:01     Assessment & Plan:  Plan  I have discontinued Tracy Mathews's azithromycin and amoxicillin-clavulanate. I am also having her maintain her cetirizine, glucosamine-chondroitin, CoQ10, vitamin B-12, Flax Seed Oil, traMADol, potassium chloride SA, levothyroxine, hydrochlorothiazide, esomeprazole, Pitavastatin Calcium, and ezetimibe.  No orders of the defined types were placed in this encounter.   Problem List Items Addressed This Visit      Unprioritized   Hypothyroidism - Primary    Check labs con't meds      Relevant Orders   TSH    Other Visit Diagnoses    Hyperlipidemia LDL goal <100       Relevant  Orders   Comprehensive metabolic panel   Lipid panel    con't statin Check labs   Follow-up: Return in about 6 months (around 04/18/2018), or if symptoms worsen or fail to improve, for hyperlipidemia, fasting, annual exam.  Tracy Held, DO

## 2017-10-16 NOTE — Assessment & Plan Note (Signed)
Check labs con't meds 

## 2017-10-29 ENCOUNTER — Other Ambulatory Visit: Payer: Self-pay | Admitting: Family Medicine

## 2017-10-29 DIAGNOSIS — E785 Hyperlipidemia, unspecified: Secondary | ICD-10-CM

## 2017-12-19 ENCOUNTER — Other Ambulatory Visit: Payer: Self-pay | Admitting: Family Medicine

## 2018-01-15 ENCOUNTER — Encounter: Payer: Self-pay | Admitting: Family Medicine

## 2018-01-15 DIAGNOSIS — Z1239 Encounter for other screening for malignant neoplasm of breast: Secondary | ICD-10-CM

## 2018-01-23 ENCOUNTER — Encounter: Payer: Self-pay | Admitting: Family Medicine

## 2018-01-24 ENCOUNTER — Encounter: Payer: Self-pay | Admitting: Family Medicine

## 2018-01-24 MED ORDER — ESOMEPRAZOLE MAGNESIUM 40 MG PO CPDR: 40.0000 mg | DELAYED_RELEASE_CAPSULE | Freq: Every day | ORAL | 1 refills | Status: DC

## 2018-01-25 ENCOUNTER — Other Ambulatory Visit: Payer: Self-pay | Admitting: Family Medicine

## 2018-01-25 MED ORDER — PANTOPRAZOLE SODIUM 40 MG PO TBEC: 40.0000 mg | DELAYED_RELEASE_TABLET | Freq: Every day | ORAL | 3 refills | Status: DC

## 2018-01-25 NOTE — Telephone Encounter (Signed)
Sent pantoprozole to [facility]

## 2018-01-28 ENCOUNTER — Encounter: Payer: Self-pay | Admitting: Family Medicine

## 2018-01-30 ENCOUNTER — Other Ambulatory Visit: Payer: Self-pay | Admitting: Family Medicine

## 2018-02-01 ENCOUNTER — Other Ambulatory Visit: Payer: Self-pay | Admitting: *Deleted

## 2018-02-01 DIAGNOSIS — E785 Hyperlipidemia, unspecified: Secondary | ICD-10-CM

## 2018-02-01 MED ORDER — EZETIMIBE 10 MG PO TABS: ORAL_TABLET | ORAL | 1 refills | Status: DC

## 2018-02-01 MED ORDER — POTASSIUM CHLORIDE CRYS ER 20 MEQ PO TBCR: 40.0000 meq | EXTENDED_RELEASE_TABLET | Freq: Every day | ORAL | 1 refills | Status: DC

## 2018-02-01 MED ORDER — PITAVASTATIN CALCIUM 4 MG PO TABS: 4.0000 mg | ORAL_TABLET | Freq: Every day | ORAL | 1 refills | Status: DC

## 2018-02-01 MED ORDER — LEVOTHYROXINE SODIUM 125 MCG PO TABS: 125.0000 ug | ORAL_TABLET | Freq: Every day | ORAL | 1 refills | Status: DC

## 2018-02-01 NOTE — Addendum Note (Signed)
Addended by: Kem Boroughs D on: 02/01/2018 04:56 PM   Modules accepted: Orders

## 2018-02-10 ENCOUNTER — Other Ambulatory Visit: Payer: Self-pay | Admitting: Family Medicine

## 2018-02-10 DIAGNOSIS — E785 Hyperlipidemia, unspecified: Secondary | ICD-10-CM

## 2018-02-18 ENCOUNTER — Encounter: Payer: Self-pay | Admitting: Family Medicine

## 2018-02-19 ENCOUNTER — Other Ambulatory Visit: Payer: Self-pay | Admitting: Family Medicine

## 2018-02-19 DIAGNOSIS — E039 Hypothyroidism, unspecified: Secondary | ICD-10-CM

## 2018-02-19 DIAGNOSIS — E785 Hyperlipidemia, unspecified: Secondary | ICD-10-CM

## 2018-02-19 MED ORDER — LEVOTHYROXINE SODIUM 125 MCG PO TABS: 125.0000 ug | ORAL_TABLET | Freq: Every day | ORAL | 1 refills | Status: DC

## 2018-02-19 MED ORDER — EZETIMIBE 10 MG PO TABS: ORAL_TABLET | ORAL | 1 refills | Status: DC

## 2018-02-19 NOTE — Telephone Encounter (Signed)
I think we changed her to livalo because of side effects to many of the others---  She can google livalo coupon --- I believe there is a coupon online she can use to get a discount If not let us know and we can try another one

## 2018-03-06 ENCOUNTER — Other Ambulatory Visit: Payer: Self-pay | Admitting: *Deleted

## 2018-03-06 MED ORDER — HYDROCHLOROTHIAZIDE 25 MG PO TABS: 25.0000 mg | ORAL_TABLET | Freq: Every day | ORAL | 1 refills | Status: DC

## 2018-03-22 ENCOUNTER — Ambulatory Visit (INDEPENDENT_AMBULATORY_CARE_PROVIDER_SITE_OTHER): Payer: 59 | Admitting: Family Medicine

## 2018-03-22 ENCOUNTER — Encounter: Payer: Self-pay | Admitting: Family Medicine

## 2018-03-22 ENCOUNTER — Other Ambulatory Visit (HOSPITAL_COMMUNITY)
Admission: RE | Admit: 2018-03-22 | Discharge: 2018-03-22 | Disposition: A | Discharge status: Home / Self Care | Payer: 59 | Source: Ambulatory Visit | Attending: Family Medicine | Admitting: Family Medicine

## 2018-03-22 VITALS — BP 112/78 | HR 70 | Temp 97.9°F | Resp 16 | Ht 63.0 in | Wt 215.8 lb

## 2018-03-22 DIAGNOSIS — E039 Hypothyroidism, unspecified: Secondary | ICD-10-CM | POA: Diagnosis not present

## 2018-03-22 DIAGNOSIS — Z124 Encounter for screening for malignant neoplasm of cervix: Secondary | ICD-10-CM | POA: Insufficient documentation

## 2018-03-22 DIAGNOSIS — E785 Hyperlipidemia, unspecified: Secondary | ICD-10-CM | POA: Diagnosis not present

## 2018-03-22 DIAGNOSIS — Z1211 Encounter for screening for malignant neoplasm of colon: Secondary | ICD-10-CM | POA: Diagnosis not present

## 2018-03-22 DIAGNOSIS — Z0000 Encounter for general adult medical examination without abnormal findings: Secondary | ICD-10-CM

## 2018-03-22 LAB — COMPREHENSIVE METABOLIC PANEL
ALT: 24 U/L (ref 0–35)
AST: 19 U/L (ref 0–37)
Albumin: 4.5 g/dL (ref 3.5–5.2)
Alkaline Phosphatase: 98 U/L (ref 39–117)
BILIRUBIN TOTAL: 0.6 mg/dL (ref 0.2–1.2)
BUN: 20 mg/dL (ref 6–23)
CO2: 28 meq/L (ref 19–32)
CREATININE: 0.92 mg/dL (ref 0.40–1.20)
Calcium: 9.7 mg/dL (ref 8.4–10.5)
Chloride: 104 mEq/L (ref 96–112)
GFR: 67.41 mL/min (ref >60.00)
Glucose, Bld: 111 mg/dL — ABNORMAL HIGH (ref 70–99)
Potassium: 3.8 mEq/L (ref 3.5–5.1)
Sodium: 142 mEq/L (ref 135–145)
Total Protein: 6.8 g/dL (ref 6.0–8.3)

## 2018-03-22 LAB — CBC WITH DIFFERENTIAL/PLATELET
BASOS ABS: 0 10*3/uL (ref 0.0–0.1)
Basophils Relative: 0.5 % (ref 0.0–3.0)
EOS ABS: 0.2 10*3/uL (ref 0.0–0.7)
Eosinophils Relative: 4.1 % (ref 0.0–5.0)
HEMATOCRIT: 40.1 % (ref 36.0–46.0)
Hemoglobin: 13.3 g/dL (ref 12.0–15.0)
LYMPHS PCT: 25.3 % (ref 12.0–46.0)
Lymphs Abs: 1.3 10*3/uL (ref 0.7–4.0)
MCHC: 33.2 g/dL (ref 30.0–36.0)
MCV: 83.8 fl (ref 78.0–100.0)
Monocytes Absolute: 0.4 10*3/uL (ref 0.1–1.0)
Monocytes Relative: 8.4 % (ref 3.0–12.0)
NEUTROS ABS: 3.1 10*3/uL (ref 1.4–7.7)
Neutrophils Relative %: 61.7 % (ref 43.0–77.0)
PLATELETS: 201 10*3/uL (ref 150.0–400.0)
RBC: 4.79 Mil/uL (ref 3.87–5.11)
RDW: 14.6 % (ref 11.5–15.5)
WBC: 5.1 10*3/uL (ref 4.0–10.5)

## 2018-03-22 LAB — LIPID PANEL
CHOL/HDL RATIO: 5
Cholesterol: 208 mg/dL — ABNORMAL HIGH (ref 0–200)
HDL: 46.1 mg/dL (ref >39.00)
LDL Cholesterol: 125 mg/dL — ABNORMAL HIGH (ref 0–99)
NonHDL: 161.53
Triglycerides: 181 mg/dL — ABNORMAL HIGH (ref 0.0–149.0)
VLDL: 36.2 mg/dL (ref 0.0–40.0)

## 2018-03-22 LAB — TSH: TSH: 5.99 u[IU]/mL — ABNORMAL HIGH (ref 0.35–4.50)

## 2018-03-22 LAB — POC HEMOCCULT BLD/STL (OFFICE/1-CARD/DIAGNOSTIC)
FECAL OCCULT BLD: NEGATIVE
OCCULT BLOOD DATE: 11012019

## 2018-03-22 MED ORDER — PITAVASTATIN CALCIUM 4 MG PO TABS: 4.0000 mg | ORAL_TABLET | Freq: Every day | ORAL | 1 refills | Status: DC

## 2018-03-22 NOTE — Assessment & Plan Note (Signed)
Check labs con't meds 

## 2018-03-22 NOTE — Assessment & Plan Note (Signed)
Tolerating statin, encouraged heart healthy diet, avoid trans fats, minimize simple carbs and saturated fats. Increase exercise as tolerated 

## 2018-03-22 NOTE — Patient Instructions (Signed)
Preventive Care 40-64 Years, Female Preventive care refers to lifestyle choices and visits with your health care provider that can promote health and wellness. What does preventive care include?  A yearly physical exam. This is also called an annual well check.  Dental exams once or twice a year.  Routine eye exams. Ask your health care provider how often you should have your eyes checked.  Personal lifestyle choices, including: ? Daily care of your teeth and gums. ? Regular physical activity. ? Eating a healthy diet. ? Avoiding tobacco and drug use. ? Limiting alcohol use. ? Practicing safe sex. ? Taking low-dose aspirin daily starting at age 54. ? Taking vitamin and mineral supplements as recommended by your health care provider. What happens during an annual well check? The services and screenings done by your health care provider during your annual well check will depend on your age, overall health, lifestyle risk factors, and family history of disease. Counseling Your health care provider may ask you questions about your:  Alcohol use.  Tobacco use.  Drug use.  Emotional well-being.  Home and relationship well-being.  Sexual activity.  Eating habits.  Work and work Statistician.  Method of birth control.  Menstrual cycle.  Pregnancy history.  Screening You may have the following tests or measurements:  Height, weight, and BMI.  Blood pressure.  Lipid and cholesterol levels. These may be checked every 5 years, or more frequently if you are over 67 years old.  Skin check.  Lung cancer screening. You may have this screening every year starting at age 70 if you have a 30-pack-year history of smoking and currently smoke or have quit within the past 15 years.  Fecal occult blood test (FOBT) of the stool. You may have this test every year starting at age 2.  Flexible sigmoidoscopy or colonoscopy. You may have a sigmoidoscopy every 5 years or a colonoscopy  every 10 years starting at age 13.  Hepatitis C blood test.  Hepatitis B blood test.  Sexually transmitted disease (STD) testing.  Diabetes screening. This is done by checking your blood sugar (glucose) after you have not eaten for a while (fasting). You may have this done every 1-3 years.  Mammogram. This may be done every 1-2 years. Talk to your health care provider about when you should start having regular mammograms. This may depend on whether you have a family history of breast cancer.  BRCA-related cancer screening. This may be done if you have a family history of breast, ovarian, tubal, or peritoneal cancers.  Pelvic exam and Pap test. This may be done every 3 years starting at age 71. Starting at age 45, this may be done every 5 years if you have a Pap test in combination with an HPV test.  Bone density scan. This is done to screen for osteoporosis. You may have this scan if you are at high risk for osteoporosis.  Discuss your test results, treatment options, and if necessary, the need for more tests with your health care provider. Vaccines Your health care provider may recommend certain vaccines, such as:  Influenza vaccine. This is recommended every year.  Tetanus, diphtheria, and acellular pertussis (Tdap, Td) vaccine. You may need a Td booster every 10 years.  Varicella vaccine. You may need this if you have not been vaccinated.  Zoster vaccine. You may need this after age 38.  Measles, mumps, and rubella (MMR) vaccine. You may need at least one dose of MMR if you were born in  1959 or later. You may also need a second dose.  Pneumococcal 13-valent conjugate (PCV13) vaccine. You may need this if you have certain conditions and were not previously vaccinated.  Pneumococcal polysaccharide (PPSV23) vaccine. You may need one or two doses if you smoke cigarettes or if you have certain conditions.  Meningococcal vaccine. You may need this if you have certain  conditions.  Hepatitis A vaccine. You may need this if you have certain conditions or if you travel or work in places where you may be exposed to hepatitis A.  Hepatitis B vaccine. You may need this if you have certain conditions or if you travel or work in places where you may be exposed to hepatitis B.  Haemophilus influenzae type b (Hib) vaccine. You may need this if you have certain conditions.  Talk to your health care provider about which screenings and vaccines you need and how often you need them. This information is not intended to replace advice given to you by your health care provider. Make sure you discuss any questions you have with your health care provider. Document Released: 06/04/2015 Document Revised: 01/26/2016 Document Reviewed: 03/09/2015 Elsevier Interactive Patient Education  [employer].

## 2018-03-22 NOTE — Progress Notes (Signed)
Subjective:     Tracy Mathews is a 52 y.o. female and is here for a comprehensive physical exam. The patient reports no problems.  Social History   Socioeconomic History  . Marital status: Married    Spouse name: Not on file  . Number of children: Not on file  . Years of education: Not on file  . Highest education level: Not on file  Occupational History  . 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 Financial resource strain: Not on file  . Food insecurity:    Worry: Not on file    Inability: Not on file  . Transportation needs:    Medical: Not on file    Non-medical: Not on file  Tobacco Use  . Smoking status: Never Smoker  . Smokeless tobacco: Never Used  Substance and Sexual Activity  . Alcohol use: Yes    Alcohol/week: 0.0 standard drinks    Comment: occasionally; "a couple times monthly"  . Drug use: No  . Sexual activity: Yes    Partners: Male  Lifestyle  . Physical activity:    Days per week: Not on file    Minutes per session: Not on file  . Stress: Not on file  Relationships  . Social connections:    Talks on phone: Not on file    Gets together: Not on file    Attends religious service: Not on file    Active member of club or organization: Not on file    Attends meetings of clubs or organizations: Not on file    Relationship status: Not on file  . Intimate partner violence:    Fear of current or ex partner: Not on file    Emotionally abused: Not on file    Physically abused: Not on file    Forced sexual activity: Not on file  Other Topics Concern  . Not on file  Social History Narrative   Exercising--no   Health Maintenance  Topic Date Due  . PAP SMEAR  01/11/2018  . MAMMOGRAM  02/27/2018  . INFLUENZA VACCINE  12/21/2018 (Originally 12/20/2017)  . TETANUS/TDAP  11/06/2021  . COLONOSCOPY  03/24/2025  . Hepatitis C Screening  Completed  . HIV Screening  Completed    The following portions of the patient's history were reviewed and  updated as appropriate: She  has a past medical history of Allergy, Arthritis, GERD (gastroesophageal reflux disease), Hemorrhoid, Hives, cardiovascular stress test, Hyperlipidemia, and Hypothyroidism. She does not have any pertinent problems on file. She  has a past surgical history that includes none; Trigger finger release (Right, 10/18/2012); wisdom teeth removal; and Ganglion cyst excision (Left, 02/17/2016). Her family history includes Arthritis in her father and mother; Diabetes in her brother and mother; Heart attack in her mother; Heart disease in her maternal uncle, maternal uncle, and maternal uncle; Heart disease (age of onset: 62) in her mother; Hyperlipidemia in her sister; Hypertension in her brother, brother, mother, and sister; Pulmonary fibrosis in her maternal uncle; Stroke in her father and mother. She  reports that she has never smoked. She has never used smokeless tobacco. She reports that she drinks alcohol. She reports that she does not use drugs. She has a current medication list which includes the following prescription(s): cetirizine, coq10, ezetimibe, flax seed oil, glucosamine-chondroitin, hydrochlorothiazide, levothyroxine, pantoprazole, pitavastatin calcium, potassium chloride sa, tramadol, and vitamin b-12. Current Outpatient Medications on File Prior to Visit  Medication Sig Dispense Refill  . cetirizine (ZYRTEC) 10 MG  tablet Take 10 mg by mouth daily.      . Coenzyme Q10 (COQ10) 200 MG CAPS Take by mouth.    . ezetimibe (ZETIA) 10 MG tablet TAKE 1 TABLET BY MOUTH EVERYDAY AT BEDTIME 90 tablet 1  . Flaxseed, Linseed, (FLAX SEED OIL) 1000 MG CAPS Take by mouth.    Marland Kitchen glucosamine-chondroitin 500-400 MG tablet Take 1 tablet by mouth daily.    . hydrochlorothiazide (HYDRODIURIL) 25 MG tablet Take 1 tablet (25 mg total) by mouth daily. 90 tablet 1  . levothyroxine (SYNTHROID, LEVOTHROID) 125 MCG tablet Take 1 tablet (125 mcg total) by mouth daily before breakfast. 90 tablet 1   . pantoprazole (PROTONIX) 40 MG tablet Take 1 tablet (40 mg total) by mouth daily. 90 tablet 3  . potassium chloride SA (K-DUR,KLOR-CON) 20 MEQ tablet Take 2 tablets (40 mEq total) by mouth daily. 180 tablet 1  . traMADol (ULTRAM) 50 MG tablet Take 1 tablet (50 mg total) by mouth every 6 (six) hours as needed. 30 tablet 0  . vitamin B-12 (CYANOCOBALAMIN) 1000 MCG tablet Take 1,000 mcg by mouth daily.     No current facility-administered medications on file prior to visit.    She is allergic to bactrim [sulfamethoxazole-trimethoprim]; niacin and related; and simvastatin..  Review of Systems Review of Systems  Constitutional: Negative for activity change, appetite change and fatigue.  HENT: Negative for hearing loss, congestion, tinnitus and ear discharge.  dentist q71m Eyes: Negative for visual disturbance (see optho q1y -- vision corrected to 20/20 with glasses).  Respiratory: Negative for cough, chest tightness and shortness of breath.   Cardiovascular: Negative for chest pain, palpitations and leg swelling.  Gastrointestinal: Negative for abdominal pain, diarrhea, constipation and abdominal distention.  Genitourinary: Negative for urgency, frequency, decreased urine volume and difficulty urinating.  Musculoskeletal: Negative for back pain, arthralgias and gait problem.  Skin: Negative for color change, pallor and rash.  Neurological: Negative for dizziness, light-headedness, numbness and headaches.  Hematological: Negative for adenopathy. Does not bruise/bleed easily.  Psychiatric/Behavioral: Negative for suicidal ideas, confusion, sleep disturbance, self-injury, dysphoric mood, decreased concentration and agitation.       Objective:    BP 112/78 (BP Location: Left Arm, Cuff Size: Normal)   Pulse 70   Temp 97.9 F (36.6 C) (Oral)   Resp 16   Ht 5\' 3"  (1.6 m)   Wt 215 lb 12.8 oz (97.9 kg)   SpO2 97%   BMI 38.23 kg/m  General appearance: alert, cooperative, appears stated age  and no distress Head: Normocephalic, without obvious abnormality, atraumatic Eyes: conjunctivae/corneas clear. PERRL, EOM's intact. Fundi benign. Ears: normal TM's and external ear canals both ears Nose: Nares normal. Septum midline. Mucosa normal. No drainage or sinus tenderness. Throat: lips, mucosa, and tongue normal; teeth and gums normal Neck: no adenopathy, no carotid bruit, no JVD, supple, symmetrical, trachea midline and thyroid not enlarged, symmetric, no tenderness/mass/nodules Back: symmetric, no curvature. ROM normal. No CVA tenderness. Lungs: clear to auscultation bilaterally Breasts: normal appearance, no masses or tenderness Heart: regular rate and rhythm, S1, S2 normal, no murmur, click, rub or gallop Abdomen: soft, non-tender; bowel sounds normal; no masses,  no organomegaly Pelvic: cervix normal in appearance, external genitalia normal, no adnexal masses or tenderness, no cervical motion tenderness, rectovaginal septum normal, uterus normal size, shape, and consistency, vagina normal without discharge and pap done, rectal heme neg brown stool Extremities: extremities normal, atraumatic, no cyanosis or edema Pulses: 2+ and symmetric Skin: Skin color, texture, turgor normal.  No rashes or lesions Lymph nodes: Cervical, supraclavicular, and axillary nodes normal. Neurologic: Alert and oriented X 3, normal strength and tone. Normal symmetric reflexes. Normal coordination and gait    Assessment:    Healthy female exam.      Plan:     ghm utd Pt refused flu, shingrix  See After Visit Summary for Counseling Recommendations   Check labs  1. Cervical cancer screening  - Cytology - PAP( [place])  2. Hyperlipidemia, unspecified hyperlipidemia type Tolerating statin, encouraged heart healthy diet, avoid trans fats, minimize simple carbs and saturated fats. Increase exercise as tolerated  3. Preventative health care See above - CBC with Differential/Platelet -  Comprehensive metabolic panel - Lipid panel - TSH - Cytology - PAP( [place])  4. Hypothyroidism, unspecified type Check labs  con't meds  - TSH  5. Hyperlipidemia LDL goal <100 Tolerating statin, encouraged heart healthy diet, avoid trans fats, minimize simple carbs and saturated fats. Increase exercise as tolerated - Pitavastatin Calcium (LIVALO) 4 MG TABS; Take 1 tablet (4 mg total) by mouth daily.  Dispense: 90 tablet; Refill: 1  6. Colon cancer screening  - POC Hemoccult Bld/Stl (1-Cd Office Dx)

## 2018-03-26 ENCOUNTER — Encounter: Payer: Self-pay | Admitting: Family Medicine

## 2018-03-26 LAB — CYTOLOGY - PAP
Diagnosis: NEGATIVE
HPV (WINDOPATH): NOT DETECTED

## 2018-03-26 MED ORDER — LEVOTHYROXINE SODIUM 137 MCG PO TABS: 137.0000 ug | ORAL_TABLET | Freq: Every day | ORAL | 2 refills | Status: DC

## 2018-03-26 NOTE — Addendum Note (Signed)
Addended byDamita Dunnings D on: 03/26/2018 08:10 AM   Modules accepted: Orders

## 2018-05-07 ENCOUNTER — Other Ambulatory Visit: Payer: Self-pay | Admitting: Family Medicine

## 2018-05-07 DIAGNOSIS — E785 Hyperlipidemia, unspecified: Secondary | ICD-10-CM

## 2018-05-07 DIAGNOSIS — E039 Hypothyroidism, unspecified: Secondary | ICD-10-CM

## 2018-06-09 ENCOUNTER — Encounter: Payer: Self-pay | Admitting: Family Medicine

## 2018-06-10 MED ORDER — HYDROCHLOROTHIAZIDE 25 MG PO TABS: 25.0000 mg | ORAL_TABLET | Freq: Every day | ORAL | 1 refills | Status: DC

## 2018-07-08 ENCOUNTER — Other Ambulatory Visit: Payer: Self-pay | Admitting: Family Medicine

## 2018-07-08 ENCOUNTER — Telehealth: Payer: Self-pay

## 2018-07-08 NOTE — Telephone Encounter (Signed)
PA initiated via Covermymeds; KEY: [id number]. Awaiting determination.

## 2018-07-08 NOTE — Telephone Encounter (Signed)
PA approved.  Request Reference Number: [id number]. LIVALO TAB 4MG  is approved through 07/09/2019. For further questions, call [phone]

## 2018-07-12 ENCOUNTER — Encounter: Payer: Self-pay | Admitting: Family Medicine

## 2018-07-15 ENCOUNTER — Other Ambulatory Visit: Payer: Self-pay | Admitting: Family Medicine

## 2018-07-15 DIAGNOSIS — I10 Essential (primary) hypertension: Secondary | ICD-10-CM

## 2018-07-15 DIAGNOSIS — R600 Localized edema: Secondary | ICD-10-CM

## 2018-07-15 DIAGNOSIS — E785 Hyperlipidemia, unspecified: Secondary | ICD-10-CM

## 2018-07-15 DIAGNOSIS — E039 Hypothyroidism, unspecified: Secondary | ICD-10-CM

## 2018-07-15 DIAGNOSIS — K219 Gastro-esophageal reflux disease without esophagitis: Secondary | ICD-10-CM

## 2018-07-15 MED ORDER — HYDROCHLOROTHIAZIDE 25 MG PO TABS: 25.0000 mg | ORAL_TABLET | Freq: Every day | ORAL | 1 refills | Status: DC

## 2018-07-15 MED ORDER — LEVOTHYROXINE SODIUM 137 MCG PO TABS: 137.0000 ug | ORAL_TABLET | Freq: Every day | ORAL | 0 refills | Status: DC

## 2018-07-15 MED ORDER — PITAVASTATIN CALCIUM 4 MG PO TABS: 4.0000 mg | ORAL_TABLET | Freq: Every day | ORAL | 1 refills | Status: DC

## 2018-07-15 MED ORDER — PANTOPRAZOLE SODIUM 40 MG PO TBEC: 40.0000 mg | DELAYED_RELEASE_TABLET | Freq: Every day | ORAL | 3 refills | Status: DC

## 2018-07-15 MED ORDER — POTASSIUM CHLORIDE CRYS ER 20 MEQ PO TBCR: 40.0000 meq | EXTENDED_RELEASE_TABLET | Freq: Every day | ORAL | 1 refills | Status: DC

## 2018-07-15 MED ORDER — EZETIMIBE 10 MG PO TABS: ORAL_TABLET | ORAL | 1 refills | Status: DC

## 2018-07-15 NOTE — Telephone Encounter (Signed)
Sent!

## 2018-08-06 ENCOUNTER — Ambulatory Visit (INDEPENDENT_AMBULATORY_CARE_PROVIDER_SITE_OTHER): Payer: 59 | Admitting: Family Medicine

## 2018-08-06 ENCOUNTER — Other Ambulatory Visit: Payer: Self-pay

## 2018-08-06 ENCOUNTER — Encounter: Payer: Self-pay | Admitting: Family Medicine

## 2018-08-06 ENCOUNTER — Ambulatory Visit: Payer: 59 | Admitting: Family Medicine

## 2018-08-06 VITALS — BP 122/80 | HR 99 | Temp 98.3°F | Resp 16 | Ht 63.0 in | Wt 213.4 lb

## 2018-08-06 DIAGNOSIS — M545 Low back pain, unspecified: Secondary | ICD-10-CM

## 2018-08-06 DIAGNOSIS — R102 Pelvic and perineal pain: Secondary | ICD-10-CM

## 2018-08-06 LAB — POC URINALSYSI DIPSTICK (AUTOMATED)
Bilirubin, UA: NEGATIVE
Blood, UA: NEGATIVE
Glucose, UA: NEGATIVE
Ketones, UA: NEGATIVE
Leukocytes, UA: NEGATIVE
Nitrite, UA: NEGATIVE
Protein, UA: NEGATIVE
Spec Grav, UA: 1.01 (ref 1.010–1.025)
Urobilinogen, UA: 0.2 E.U./dL
pH, UA: 6 (ref 5.0–8.0)

## 2018-08-06 NOTE — Progress Notes (Signed)
Patient ID: Tracy Mathews, female    DOB: 1966-05-21  Age: 53 y.o. MRN: [id number]    Subjective:  Subjective  HPI Tracy Mathews presents for R low back pain.  No known injury.    Review of Systems  Constitutional: Negative for appetite change, diaphoresis, fatigue and unexpected weight change.  Eyes: Negative for pain, redness and visual disturbance.  Respiratory: Negative for cough, chest tightness, shortness of breath and wheezing.   Cardiovascular: Negative for chest pain, palpitations and leg swelling.  Gastrointestinal: Positive for abdominal pain. Negative for blood in stool, constipation, diarrhea, nausea, rectal pain and vomiting.  Endocrine: Negative for cold intolerance, heat intolerance, polydipsia, polyphagia and polyuria.  Genitourinary: Negative for difficulty urinating, dysuria and frequency.  Musculoskeletal: Positive for back pain.  Neurological: Negative for dizziness, light-headedness, numbness and headaches.    History Past Medical History:  Diagnosis Date  . Allergy   . Arthritis   . GERD (gastroesophageal reflux disease)   . Hemorrhoid   . Hives    chronic  . Hx of cardiovascular stress test    ETT-Myoview (9/15):  no ischemia, EF 60%; low risk  . Hyperlipidemia   . Hypothyroidism     She has a past surgical history that includes none; Trigger finger release (Right, 10/18/2012); wisdom teeth removal; and Ganglion cyst excision (Left, 02/17/2016).   Her family history includes Arthritis in her father and mother; Diabetes in her brother and mother; Heart attack in her mother; Heart disease in her maternal uncle, maternal uncle, and maternal uncle; Heart disease (age of onset: 69) in her mother; Hyperlipidemia in her sister; Hypertension in her brother, brother, mother, and sister; Pulmonary fibrosis in her maternal uncle; Stroke in her father and mother.She reports that she has never smoked. She has never used smokeless tobacco. She reports current alcohol use.  She reports that she does not use drugs.  Current Outpatient Medications on File Prior to Visit  Medication Sig Dispense Refill  . cetirizine (ZYRTEC) 10 MG tablet Take 10 mg by mouth daily.      . Coenzyme Q10 (COQ10) 200 MG CAPS Take by mouth.    . ezetimibe (ZETIA) 10 MG tablet 1 po qd 90 tablet 1  . Flaxseed, Linseed, (FLAX SEED OIL) 1000 MG CAPS Take by mouth.    Marland Kitchen glucosamine-chondroitin 500-400 MG tablet Take 1 tablet by mouth daily.    . hydrochlorothiazide (HYDRODIURIL) 25 MG tablet Take 1 tablet (25 mg total) by mouth daily. 90 tablet 1  . levothyroxine (SYNTHROID, LEVOTHROID) 137 MCG tablet Take 1 tablet (137 mcg total) by mouth daily before breakfast. 90 tablet 0  . pantoprazole (PROTONIX) 40 MG tablet Take 1 tablet (40 mg total) by mouth daily. 90 tablet 3  . Pitavastatin Calcium (LIVALO) 4 MG TABS Take 1 tablet (4 mg total) by mouth daily. 90 tablet 1  . potassium chloride SA (K-DUR,KLOR-CON) 20 MEQ tablet Take 2 tablets (40 mEq total) by mouth daily. 180 tablet 1  . traMADol (ULTRAM) 50 MG tablet Take 1 tablet (50 mg total) by mouth every 6 (six) hours as needed. 30 tablet 0  . vitamin B-12 (CYANOCOBALAMIN) 1000 MCG tablet Take 1,000 mcg by mouth daily.     No current facility-administered medications on file prior to visit.      Objective:  Objective  Physical Exam Vitals signs and nursing note reviewed.  Constitutional:      Appearance: She is well-developed.  HENT:     Head: Normocephalic and atraumatic.  Eyes:     Conjunctiva/sclera: Conjunctivae normal.  Neck:     Musculoskeletal: Normal range of motion and neck supple.     Thyroid: No thyromegaly.     Vascular: No carotid bruit or JVD.  Cardiovascular:     Rate and Rhythm: Normal rate and regular rhythm.     Heart sounds: Normal heart sounds. No murmur.  Pulmonary:     Effort: Pulmonary effort is normal. No respiratory distress.     Breath sounds: Normal breath sounds. No wheezing or rales.  Chest:      Chest wall: No tenderness.  Abdominal:     General: Bowel sounds are normal.     Palpations: Abdomen is soft.     Tenderness: There is abdominal tenderness in the right lower quadrant. There is right CVA tenderness. There is no guarding. Negative signs include Murphy's sign, McBurney's sign and obturator sign.     Hernia: No hernia is present.  Musculoskeletal: Normal range of motion.  Neurological:     General: No focal deficit present.     Mental Status: She is alert. She is disoriented.     Cranial Nerves: No cranial nerve deficit.     Sensory: No sensory deficit.     Motor: No weakness.     Coordination: Coordination normal.     Gait: Gait normal.     Deep Tendon Reflexes: Reflexes normal.    BP 122/80 (BP Location: Right Arm, Cuff Size: Large)   Pulse 99   Temp 98.3 F (36.8 C) (Oral)   Resp 16   Ht 5\' 3"  (1.6 m)   Wt 213 lb 6.4 oz (96.8 kg)   SpO2 96%   BMI 37.80 kg/m  Wt Readings from Last 3 Encounters:  08/06/18 213 lb 6.4 oz (96.8 kg)  03/22/18 215 lb 12.8 oz (97.9 kg)  10/16/17 211 lb 6.4 oz (95.9 kg)     Lab Results  Component Value Date   WBC 5.1 03/22/2018   HGB 13.3 03/22/2018   HCT 40.1 03/22/2018   PLT 201.0 03/22/2018   GLUCOSE 111 (H) 03/22/2018   CHOL 208 (H) 03/22/2018   TRIG 181.0 (H) 03/22/2018   HDL 46.10 03/22/2018   LDLDIRECT 188.6 01/10/2013   LDLCALC 125 (H) 03/22/2018   ALT 24 03/22/2018   AST 19 03/22/2018   NA 142 03/22/2018   K 3.8 03/22/2018   CL 104 03/22/2018   CREATININE 0.92 03/22/2018   BUN 20 03/22/2018   CO2 28 03/22/2018   TSH 5.99 (H) 03/22/2018   HGBA1C 5.8 01/10/2013   MICROALBUR <0.7 01/12/2015    No results found.   Assessment & Plan:  Plan  I am having Tracy Mathews maintain her cetirizine, glucosamine-chondroitin, CoQ10, vitamin B-12, Flax Seed Oil, traMADol, potassium chloride SA, pantoprazole, levothyroxine, hydrochlorothiazide, ezetimibe, and Pitavastatin Calcium.  No orders of the defined types were  placed in this encounter.   Problem List Items Addressed This Visit    None    Visit Diagnoses    Acute right-sided low back pain without sciatica    -  Primary   Relevant Orders   CBC with Differential/Platelet   Comprehensive metabolic panel   POCT Urinalysis Dipstick (Automated) (Completed)   Pelvic pain       Relevant Orders   US Abdomen Complete   US PELVIS (TRANSABDOMINAL ONLY)   US PELVIS TRANSVANGINAL NON-OB (TV ONLY)    if pain worsens -- go to er    Follow-up: No follow-ups on  file.  Ann Held, DO

## 2018-08-06 NOTE — Patient Instructions (Signed)
Acute Back Pain, Adult  Acute back pain is sudden and usually short-lived. It is often caused by an injury to the muscles and tissues in the back. The injury may result from:   A muscle or ligament getting overstretched or torn (strained). Ligaments are tissues that connect bones to each other. Lifting something improperly can cause a back strain.   Wear and tear (degeneration) of the spinal disks. Spinal disks are circular tissue that provides cushioning between the bones of the spine (vertebrae).   Twisting motions, such as while playing sports or doing yard work.   A hit to the back.   Arthritis.  You may have a physical exam, lab tests, and imaging tests to find the cause of your pain. Acute back pain usually goes away with rest and home care.  Follow these instructions at home:  Managing pain, stiffness, and swelling   Take over-the-counter and prescription medicines only as told by your health care provider.   Your health care provider may recommend applying ice during the first 24-48 hours after your pain starts. To do this:  ? Put ice in a plastic bag.  ? Place a towel between your skin and the bag.  ? Leave the ice on for 20 minutes, 2-3 times a day.   If directed, apply heat to the affected area as often as told by your health care provider. Use the heat source that your health care provider recommends, such as a moist heat pack or a heating pad.  ? Place a towel between your skin and the heat source.  ? Leave the heat on for 20-30 minutes.  ? Remove the heat if your skin turns bright red. This is especially important if you are unable to feel pain, heat, or cold. You have a greater risk of getting burned.  Activity     Do not stay in bed. Staying in bed for more than 1-2 days can delay your recovery.   Sit up and stand up straight. Avoid leaning forward when you sit, or hunching over when you stand.  ? If you work at a desk, sit close to it so you do not need to lean over. Keep your chin tucked  in. Keep your neck drawn back, and keep your elbows bent at a right angle. Your arms should look like the letter "L."  ? Sit high and close to the steering wheel when you drive. Add lower back (lumbar) support to your car seat, if needed.   Take short walks on even surfaces as soon as you are able. Try to increase the length of time you walk each day.   Do not sit, drive, or stand in one place for more than 30 minutes at a time. Sitting or standing for long periods of time can put stress on your back.   Do not drive or use heavy machinery while taking prescription pain medicine.   Use proper lifting techniques. When you bend and lift, use positions that put less stress on your back:  ? Bend your knees.  ? Keep the load close to your body.  ? Avoid twisting.   Exercise regularly as told by your health care provider. Exercising helps your back heal faster and helps prevent back injuries by keeping muscles strong and flexible.   Work with a physical therapist to make a safe exercise program, as recommended by your health care provider. Do any exercises as told by your physical therapist.  Lifestyle   Maintain   a healthy weight. Extra weight puts stress on your back and makes it difficult to have good posture.   Avoid activities or situations that make you feel anxious or stressed. Stress and anxiety increase muscle tension and can make back pain worse. Learn ways to manage anxiety and stress, such as through exercise.  General instructions   Sleep on a firm mattress in a comfortable position. Try lying on your side with your knees slightly bent. If you lie on your back, put a pillow under your knees.   Follow your treatment plan as told by your health care provider. This may include:  ? Cognitive or behavioral therapy.  ? Acupuncture or massage therapy.  ? Meditation or yoga.  Contact a health care provider if:   You have pain that is not relieved with rest or medicine.   You have increasing pain going down  into your legs or buttocks.   Your pain does not improve after 2 weeks.   You have pain at night.   You lose weight without trying.   You have a fever or chills.  Get help right away if:   You develop new bowel or bladder control problems.   You have unusual weakness or numbness in your arms or legs.   You develop nausea or vomiting.   You develop abdominal pain.   You feel faint.  Summary   Acute back pain is sudden and usually short-lived.   Use proper lifting techniques. When you bend and lift, use positions that put less stress on your back.   Take over-the-counter and prescription medicines and apply heat or ice as directed by your health care provider.  This information is not intended to replace advice given to you by your health care provider. Make sure you discuss any questions you have with your health care provider.  Document Released: 05/08/2005 Document Revised: 12/13/2017 Document Reviewed: 12/20/2016  Elsevier Interactive Patient Education  2019 [employer]

## 2018-08-07 LAB — CBC WITH DIFFERENTIAL/PLATELET
Basophils Absolute: 0.1 10*3/uL (ref 0.0–0.1)
Basophils Relative: 0.9 % (ref 0.0–3.0)
EOS ABS: 0.2 10*3/uL (ref 0.0–0.7)
Eosinophils Relative: 2.8 % (ref 0.0–5.0)
HEMATOCRIT: 42.1 % (ref 36.0–46.0)
Hemoglobin: 13.9 g/dL (ref 12.0–15.0)
LYMPHS PCT: 24.7 % (ref 12.0–46.0)
Lymphs Abs: 1.8 10*3/uL (ref 0.7–4.0)
MCHC: 32.9 g/dL (ref 30.0–36.0)
MCV: 84.9 fl (ref 78.0–100.0)
Monocytes Absolute: 0.6 10*3/uL (ref 0.1–1.0)
Monocytes Relative: 8.8 % (ref 3.0–12.0)
Neutro Abs: 4.5 10*3/uL (ref 1.4–7.7)
Neutrophils Relative %: 62.8 % (ref 43.0–77.0)
Platelets: 242 10*3/uL (ref 150.0–400.0)
RBC: 4.97 Mil/uL (ref 3.87–5.11)
RDW: 14.4 % (ref 11.5–15.5)
WBC: 7.2 10*3/uL (ref 4.0–10.5)

## 2018-08-07 LAB — COMPREHENSIVE METABOLIC PANEL
ALBUMIN: 4.5 g/dL (ref 3.5–5.2)
ALT: 24 U/L (ref 0–35)
AST: 17 U/L (ref 0–37)
Alkaline Phosphatase: 98 U/L (ref 39–117)
BUN: 20 mg/dL (ref 6–23)
CHLORIDE: 101 meq/L (ref 96–112)
CO2: 28 mEq/L (ref 19–32)
Calcium: 9.8 mg/dL (ref 8.4–10.5)
Creatinine, Ser: 0.85 mg/dL (ref 0.40–1.20)
GFR: 69.4 mL/min (ref >60.00)
Glucose, Bld: 98 mg/dL (ref 70–99)
Potassium: 3.9 mEq/L (ref 3.5–5.1)
Sodium: 138 mEq/L (ref 135–145)
Total Bilirubin: 0.4 mg/dL (ref 0.2–1.2)
Total Protein: 6.9 g/dL (ref 6.0–8.3)

## 2018-08-13 ENCOUNTER — Other Ambulatory Visit (HOSPITAL_BASED_OUTPATIENT_CLINIC_OR_DEPARTMENT_OTHER): Payer: 59

## 2018-08-13 ENCOUNTER — Ambulatory Visit (HOSPITAL_BASED_OUTPATIENT_CLINIC_OR_DEPARTMENT_OTHER): Payer: 59

## 2018-08-27 ENCOUNTER — Telehealth: Payer: Self-pay | Admitting: *Deleted

## 2018-08-27 NOTE — Telephone Encounter (Signed)
Attempted to reach pt regarding OV on 09/20/18 for a 6 month follow up. Left detailed message on cell# to call the office to discuss converting to [facility] Visit and verify platform for visit.

## 2018-09-16 NOTE — Telephone Encounter (Signed)
Spoke with pt and scheduled her for Virtual Visit for f/u of cholesterol, thyroid and edema.

## 2018-09-17 ENCOUNTER — Other Ambulatory Visit: Payer: Self-pay

## 2018-09-17 ENCOUNTER — Encounter: Payer: Self-pay | Admitting: Family Medicine

## 2018-09-17 ENCOUNTER — Ambulatory Visit (INDEPENDENT_AMBULATORY_CARE_PROVIDER_SITE_OTHER): Payer: 59 | Admitting: Family Medicine

## 2018-09-17 DIAGNOSIS — E039 Hypothyroidism, unspecified: Secondary | ICD-10-CM | POA: Diagnosis not present

## 2018-09-17 DIAGNOSIS — E785 Hyperlipidemia, unspecified: Secondary | ICD-10-CM | POA: Diagnosis not present

## 2018-09-17 NOTE — Progress Notes (Signed)
Virtual Visit via Video Note  I connected with Tracy Mathews on 09/17/18 at  9:30 AM EDT by a video enabled telemedicine application and verified that I am speaking with the correct person using two identifiers.   I discussed the limitations of evaluation and management by telemedicine and the availability of in person appointments. The patient expressed understanding and agreed to proceed.  History of Present Illness: Pt is home   She needs f/u lipid and thyroid   no complaints  We lost connect via video and had to transition to tele visit  Observations/Objective: Temp 96.6  Wt 213  Ht 5 ft 3.5 in  Pt is in NAD  Assessment and Plan: 1. Hyperlipidemia, unspecified hyperlipidemia type Encouraged heart healthy diet, increase exercise, avoid trans fats, consider a krill oil cap daily - Lipid panel; Future - Comprehensive metabolic panel; Future  2. Hypothyroidism, unspecified type Stable Check labs  - TSH; Future  Follow Up Instructions:    I discussed the assessment and treatment plan with the patient. The patient was provided an opportunity to ask questions and all were answered. The patient agreed with the plan and demonstrated an understanding of the instructions.   The patient was advised to call back or seek an in-person evaluation if the symptoms worsen or if the condition fails to improve as anticipated.  I provided 25 minutes of non-face-to-face time during this encounter.   Ann Held, DO

## 2018-09-20 ENCOUNTER — Ambulatory Visit: Payer: 59 | Admitting: Family Medicine

## 2018-09-25 ENCOUNTER — Other Ambulatory Visit: Payer: Self-pay

## 2018-09-25 ENCOUNTER — Other Ambulatory Visit (INDEPENDENT_AMBULATORY_CARE_PROVIDER_SITE_OTHER): Payer: 59

## 2018-09-25 DIAGNOSIS — E785 Hyperlipidemia, unspecified: Secondary | ICD-10-CM | POA: Diagnosis not present

## 2018-09-25 DIAGNOSIS — E039 Hypothyroidism, unspecified: Secondary | ICD-10-CM | POA: Diagnosis not present

## 2018-09-25 LAB — COMPREHENSIVE METABOLIC PANEL
ALT: 31 U/L (ref 0–35)
AST: 19 U/L (ref 0–37)
Albumin: 4.2 g/dL (ref 3.5–5.2)
Alkaline Phosphatase: 98 U/L (ref 39–117)
BUN: 17 mg/dL (ref 6–23)
CO2: 27 mEq/L (ref 19–32)
Calcium: 9.4 mg/dL (ref 8.4–10.5)
Chloride: 102 mEq/L (ref 96–112)
Creatinine, Ser: 0.85 mg/dL (ref 0.40–1.20)
GFR: 69.36 mL/min (ref >60.00)
Glucose, Bld: 105 mg/dL — ABNORMAL HIGH (ref 70–99)
Potassium: 4 mEq/L (ref 3.5–5.1)
Sodium: 140 mEq/L (ref 135–145)
Total Bilirubin: 0.6 mg/dL (ref 0.2–1.2)
Total Protein: 6.4 g/dL (ref 6.0–8.3)

## 2018-09-25 LAB — TSH: TSH: 0.69 u[IU]/mL (ref 0.35–4.50)

## 2018-09-25 LAB — LIPID PANEL
Cholesterol: 178 mg/dL (ref 0–200)
HDL: 51.6 mg/dL (ref >39.00)
LDL Cholesterol: 91 mg/dL (ref 0–99)
NonHDL: 126.59
Total CHOL/HDL Ratio: 3
Triglycerides: 178 mg/dL — ABNORMAL HIGH (ref 0.0–149.0)
VLDL: 35.6 mg/dL (ref 0.0–40.0)

## 2018-12-30 ENCOUNTER — Other Ambulatory Visit: Payer: Self-pay | Admitting: Family Medicine

## 2018-12-30 DIAGNOSIS — E039 Hypothyroidism, unspecified: Secondary | ICD-10-CM

## 2018-12-30 DIAGNOSIS — E785 Hyperlipidemia, unspecified: Secondary | ICD-10-CM

## 2019-01-22 ENCOUNTER — Other Ambulatory Visit: Payer: Self-pay | Admitting: *Deleted

## 2019-01-22 DIAGNOSIS — E039 Hypothyroidism, unspecified: Secondary | ICD-10-CM

## 2019-01-22 MED ORDER — LEVOTHYROXINE SODIUM 137 MCG PO TABS: 137.0000 ug | ORAL_TABLET | Freq: Every day | ORAL | 0 refills | Status: DC

## 2019-03-12 ENCOUNTER — Other Ambulatory Visit: Payer: Self-pay | Admitting: Family Medicine

## 2019-03-12 DIAGNOSIS — R600 Localized edema: Secondary | ICD-10-CM

## 2019-03-17 ENCOUNTER — Other Ambulatory Visit: Payer: Self-pay | Admitting: Family Medicine

## 2019-03-17 DIAGNOSIS — E039 Hypothyroidism, unspecified: Secondary | ICD-10-CM

## 2019-03-21 ENCOUNTER — Encounter: Payer: Self-pay | Admitting: Family Medicine

## 2019-03-21 DIAGNOSIS — R600 Localized edema: Secondary | ICD-10-CM

## 2019-03-24 MED ORDER — HYDROCHLOROTHIAZIDE 25 MG PO TABS: 25.0000 mg | ORAL_TABLET | Freq: Every day | ORAL | 0 refills | Status: DC

## 2019-03-28 ENCOUNTER — Encounter: Payer: 59 | Admitting: Family Medicine

## 2019-04-07 ENCOUNTER — Other Ambulatory Visit: Payer: Self-pay | Admitting: Family Medicine

## 2019-04-07 DIAGNOSIS — R600 Localized edema: Secondary | ICD-10-CM

## 2019-04-29 ENCOUNTER — Encounter: Payer: Self-pay | Admitting: Family Medicine

## 2019-04-29 DIAGNOSIS — K219 Gastro-esophageal reflux disease without esophagitis: Secondary | ICD-10-CM

## 2019-04-30 MED ORDER — PANTOPRAZOLE SODIUM 40 MG PO TBEC: 40.0000 mg | DELAYED_RELEASE_TABLET | Freq: Every day | ORAL | 0 refills | Status: DC

## 2019-06-15 ENCOUNTER — Encounter: Payer: Self-pay | Admitting: Family Medicine

## 2019-06-15 DIAGNOSIS — E785 Hyperlipidemia, unspecified: Secondary | ICD-10-CM

## 2019-06-16 ENCOUNTER — Other Ambulatory Visit: Payer: Self-pay | Admitting: Family Medicine

## 2019-06-16 DIAGNOSIS — R600 Localized edema: Secondary | ICD-10-CM

## 2019-06-16 MED ORDER — EZETIMIBE 10 MG PO TABS: ORAL_TABLET | ORAL | 0 refills | Status: DC

## 2019-08-08 ENCOUNTER — Other Ambulatory Visit: Payer: Self-pay

## 2019-08-10 ENCOUNTER — Encounter: Payer: Self-pay | Admitting: Family Medicine

## 2019-08-10 DIAGNOSIS — K219 Gastro-esophageal reflux disease without esophagitis: Secondary | ICD-10-CM

## 2019-08-10 DIAGNOSIS — E039 Hypothyroidism, unspecified: Secondary | ICD-10-CM

## 2019-08-10 MED ORDER — LEVOTHYROXINE SODIUM 137 MCG PO TABS: ORAL_TABLET | ORAL | 0 refills | Status: DC

## 2019-08-10 MED ORDER — PANTOPRAZOLE SODIUM 40 MG PO TBEC: 40.0000 mg | DELAYED_RELEASE_TABLET | Freq: Every day | ORAL | 0 refills | Status: DC

## 2019-08-27 ENCOUNTER — Other Ambulatory Visit: Payer: Self-pay

## 2019-08-27 ENCOUNTER — Encounter: Payer: Self-pay | Admitting: Family Medicine

## 2019-09-13 ENCOUNTER — Other Ambulatory Visit: Payer: Self-pay | Admitting: Family Medicine

## 2019-09-13 DIAGNOSIS — R600 Localized edema: Secondary | ICD-10-CM

## 2019-09-17 NOTE — Progress Notes (Signed)
This encounter was created in error - please disregard.

## 2019-10-09 DIAGNOSIS — Z20822 Contact with and (suspected) exposure to covid-19: Secondary | ICD-10-CM | POA: Diagnosis not present

## 2019-10-09 DIAGNOSIS — R05 Cough: Secondary | ICD-10-CM | POA: Diagnosis not present

## 2019-10-12 DIAGNOSIS — H66003 Acute suppurative otitis media without spontaneous rupture of ear drum, bilateral: Secondary | ICD-10-CM | POA: Diagnosis not present

## 2019-10-12 DIAGNOSIS — R062 Wheezing: Secondary | ICD-10-CM | POA: Diagnosis not present

## 2019-10-12 DIAGNOSIS — J069 Acute upper respiratory infection, unspecified: Secondary | ICD-10-CM | POA: Diagnosis not present

## 2019-10-13 ENCOUNTER — Other Ambulatory Visit: Payer: Self-pay | Admitting: Family Medicine

## 2019-10-13 DIAGNOSIS — R600 Localized edema: Secondary | ICD-10-CM

## 2019-10-22 ENCOUNTER — Other Ambulatory Visit: Payer: Self-pay | Admitting: Family Medicine

## 2019-10-22 DIAGNOSIS — R600 Localized edema: Secondary | ICD-10-CM

## 2019-10-24 DIAGNOSIS — R9389 Abnormal findings on diagnostic imaging of other specified body structures: Secondary | ICD-10-CM | POA: Diagnosis not present

## 2019-10-24 DIAGNOSIS — R05 Cough: Secondary | ICD-10-CM | POA: Diagnosis not present

## 2019-10-24 DIAGNOSIS — J40 Bronchitis, not specified as acute or chronic: Secondary | ICD-10-CM | POA: Diagnosis not present

## 2019-11-01 ENCOUNTER — Other Ambulatory Visit: Payer: Self-pay | Admitting: Family Medicine

## 2019-11-01 DIAGNOSIS — K219 Gastro-esophageal reflux disease without esophagitis: Secondary | ICD-10-CM

## 2019-11-03 ENCOUNTER — Other Ambulatory Visit: Payer: Self-pay | Admitting: Family Medicine

## 2019-11-03 DIAGNOSIS — E039 Hypothyroidism, unspecified: Secondary | ICD-10-CM

## 2019-11-05 ENCOUNTER — Other Ambulatory Visit: Payer: Self-pay

## 2019-11-05 ENCOUNTER — Telehealth (INDEPENDENT_AMBULATORY_CARE_PROVIDER_SITE_OTHER): Payer: BC Managed Care – PPO | Admitting: Family Medicine

## 2019-11-05 ENCOUNTER — Encounter: Payer: Self-pay | Admitting: Family Medicine

## 2019-11-05 DIAGNOSIS — R600 Localized edema: Secondary | ICD-10-CM

## 2019-11-05 DIAGNOSIS — E039 Hypothyroidism, unspecified: Secondary | ICD-10-CM

## 2019-11-05 DIAGNOSIS — K219 Gastro-esophageal reflux disease without esophagitis: Secondary | ICD-10-CM

## 2019-11-05 DIAGNOSIS — E785 Hyperlipidemia, unspecified: Secondary | ICD-10-CM

## 2019-11-05 LAB — LIPID PANEL
Cholesterol: 211 mg/dL — ABNORMAL HIGH (ref 0–200)
HDL: 50.6 mg/dL (ref >39.00)
LDL Cholesterol: 122 mg/dL — ABNORMAL HIGH (ref 0–99)
NonHDL: 160.21
Total CHOL/HDL Ratio: 4
Triglycerides: 193 mg/dL — ABNORMAL HIGH (ref 0.0–149.0)
VLDL: 38.6 mg/dL (ref 0.0–40.0)

## 2019-11-05 LAB — COMPREHENSIVE METABOLIC PANEL
ALT: 35 U/L (ref 0–35)
AST: 25 U/L (ref 0–37)
Albumin: 4.1 g/dL (ref 3.5–5.2)
Alkaline Phosphatase: 102 U/L (ref 39–117)
BUN: 15 mg/dL (ref 6–23)
CO2: 27 mEq/L (ref 19–32)
Calcium: 9.2 mg/dL (ref 8.4–10.5)
Chloride: 105 mEq/L (ref 96–112)
Creatinine, Ser: 0.86 mg/dL (ref 0.40–1.20)
GFR: 68.16 mL/min (ref >60.00)
Glucose, Bld: 102 mg/dL — ABNORMAL HIGH (ref 70–99)
Potassium: 4.4 mEq/L (ref 3.5–5.1)
Sodium: 140 mEq/L (ref 135–145)
Total Bilirubin: 0.5 mg/dL (ref 0.2–1.2)
Total Protein: 6.2 g/dL (ref 6.0–8.3)

## 2019-11-05 LAB — TSH: TSH: 0.28 u[IU]/mL — ABNORMAL LOW (ref 0.35–4.50)

## 2019-11-05 MED ORDER — HYDROCHLOROTHIAZIDE 25 MG PO TABS: 25.0000 mg | ORAL_TABLET | Freq: Every day | ORAL | 1 refills | Status: DC

## 2019-11-05 MED ORDER — EZETIMIBE 10 MG PO TABS: ORAL_TABLET | ORAL | 0 refills | Status: DC

## 2019-11-05 MED ORDER — LEVOTHYROXINE SODIUM 137 MCG PO TABS: ORAL_TABLET | ORAL | 1 refills | Status: DC

## 2019-11-05 MED ORDER — POTASSIUM CHLORIDE CRYS ER 20 MEQ PO TBCR: 40.0000 meq | EXTENDED_RELEASE_TABLET | Freq: Every day | ORAL | 1 refills | Status: DC

## 2019-11-05 MED ORDER — PANTOPRAZOLE SODIUM 40 MG PO TBEC: 40.0000 mg | DELAYED_RELEASE_TABLET | Freq: Every day | ORAL | 0 refills | Status: DC

## 2019-11-05 NOTE — Progress Notes (Signed)
Virtual Visit via Video Note  I connected with Tracy Mathews on 11/05/19 at  9:00 AM EDT by a video enabled telemedicine application and verified that I am speaking with the correct person using two identifiers.  Location: Patient: [facility] Provider: home    I discussed the limitations of evaluation and management by telemedicine and the availability of in person appointments. The patient expressed understanding and agreed to proceed.  History of Present Illness: Pt is in her car alone [facility]-- she need f/u chol and thyroid   No complaints   Observations/Objective: There were no vitals filed for this visit. Pt is in nad  No sob  bp in may 130 / 90 -- but she was sick then    Assessment and Plan: 1. Lower extremity edema Stable  - hydrochlorothiazide (HYDRODIURIL) 25 MG tablet; Take 1 tablet (25 mg total) by mouth daily.  Dispense: 90 tablet; Refill: 1 - potassium chloride SA (KLOR-CON M20) 20 MEQ tablet; Take 2 tablets (40 mEq total) by mouth daily.  Dispense: 180 tablet; Refill: 1  2. Hyperlipidemia, unspecified hyperlipidemia type Encouraged heart healthy diet, increase exercise, avoid trans fats, consider a krill oil cap daily - ezetimibe (ZETIA) 10 MG tablet; TAKE 1 TABLET BY MOUTH EVERY DAY  Dispense: 90 tablet; Refill: 0 - Lipid panel - Comprehensive metabolic panel  3. Gastroesophageal reflux disease without esophagitis Stable con't med - pantoprazole (PROTONIX) 40 MG tablet; Take 1 tablet (40 mg total) by mouth daily.  Dispense: 90 tablet; Refill: 0  4. Hypothyroidism, unspecified type Stable Check labs and con't meds - levothyroxine (SYNTHROID) 137 MCG tablet; TAKE 1 TABLET BY MOUTH EVERY DAY BEFORE BREAKFAST  Dispense: 90 tablet; Refill: 1 - TSH   Follow Up Instructions:    I discussed the assessment and treatment plan with the patient. The patient was provided an opportunity to ask questions and  all were answered. The patient agreed with the plan and demonstrated an understanding of the instructions.   The patient was advised to call back or seek an in-person evaluation if the symptoms worsen or if the condition fails to improve as anticipated.  I provided 25  minutes of non-face-to-face time during this encounter.   Ann Held, DO

## 2019-11-06 ENCOUNTER — Encounter: Payer: Self-pay | Admitting: Family Medicine

## 2019-11-09 ENCOUNTER — Encounter: Payer: Self-pay | Admitting: Family Medicine

## 2019-11-20 ENCOUNTER — Encounter: Payer: Self-pay | Admitting: Family Medicine

## 2019-11-20 DIAGNOSIS — K219 Gastro-esophageal reflux disease without esophagitis: Secondary | ICD-10-CM

## 2019-11-21 ENCOUNTER — Other Ambulatory Visit: Payer: Self-pay | Admitting: Family Medicine

## 2019-11-21 DIAGNOSIS — K219 Gastro-esophageal reflux disease without esophagitis: Secondary | ICD-10-CM

## 2019-11-21 MED ORDER — DEXILANT 60 MG PO CPDR: 60.0000 mg | DELAYED_RELEASE_CAPSULE | Freq: Every day | ORAL | 2 refills | Status: DC

## 2019-11-21 NOTE — Telephone Encounter (Signed)
I have sent dexilant to the pharmacy I also put a referral in for GI--- she may need and endoscopy to make sure there is no ulcer from prednisone

## 2019-11-28 ENCOUNTER — Encounter: Payer: Self-pay | Admitting: Internal Medicine

## 2019-11-28 NOTE — Telephone Encounter (Signed)
Are we able to to get this switched please?

## 2019-11-28 NOTE — Telephone Encounter (Signed)
New referral placed.

## 2019-12-17 DIAGNOSIS — K219 Gastro-esophageal reflux disease without esophagitis: Secondary | ICD-10-CM | POA: Diagnosis not present

## 2019-12-17 DIAGNOSIS — R110 Nausea: Secondary | ICD-10-CM | POA: Diagnosis not present

## 2019-12-18 DIAGNOSIS — K2970 Gastritis, unspecified, without bleeding: Secondary | ICD-10-CM | POA: Diagnosis not present

## 2019-12-18 DIAGNOSIS — K219 Gastro-esophageal reflux disease without esophagitis: Secondary | ICD-10-CM | POA: Diagnosis not present

## 2019-12-18 DIAGNOSIS — R110 Nausea: Secondary | ICD-10-CM | POA: Diagnosis not present

## 2019-12-18 DIAGNOSIS — K2930 Chronic superficial gastritis without bleeding: Secondary | ICD-10-CM | POA: Diagnosis not present

## 2019-12-26 DIAGNOSIS — U071 COVID-19: Secondary | ICD-10-CM | POA: Diagnosis not present

## 2020-01-22 ENCOUNTER — Encounter: Payer: BC Managed Care – PPO | Admitting: Family Medicine

## 2020-01-28 ENCOUNTER — Ambulatory Visit: Payer: BC Managed Care – PPO | Admitting: Internal Medicine

## 2020-02-09 ENCOUNTER — Other Ambulatory Visit: Payer: Self-pay | Admitting: Family Medicine

## 2020-02-09 DIAGNOSIS — K219 Gastro-esophageal reflux disease without esophagitis: Secondary | ICD-10-CM

## 2020-02-23 ENCOUNTER — Encounter: Payer: Self-pay | Admitting: Family Medicine

## 2020-02-23 DIAGNOSIS — E785 Hyperlipidemia, unspecified: Secondary | ICD-10-CM

## 2020-02-23 DIAGNOSIS — K219 Gastro-esophageal reflux disease without esophagitis: Secondary | ICD-10-CM

## 2020-02-23 MED ORDER — EZETIMIBE 10 MG PO TABS: ORAL_TABLET | ORAL | 1 refills | Status: DC

## 2020-02-23 MED ORDER — DEXILANT 60 MG PO CPDR: 60.0000 mg | DELAYED_RELEASE_CAPSULE | Freq: Every day | ORAL | 1 refills | Status: DC

## 2020-03-19 ENCOUNTER — Other Ambulatory Visit: Payer: Self-pay

## 2020-03-19 ENCOUNTER — Encounter: Payer: Self-pay | Admitting: Family Medicine

## 2020-03-19 ENCOUNTER — Ambulatory Visit (INDEPENDENT_AMBULATORY_CARE_PROVIDER_SITE_OTHER): Payer: BC Managed Care – PPO | Admitting: Family Medicine

## 2020-03-19 VITALS — BP 104/70 | HR 96 | Temp 98.2°F | Resp 18 | Ht 63.0 in | Wt 217.0 lb

## 2020-03-19 DIAGNOSIS — E785 Hyperlipidemia, unspecified: Secondary | ICD-10-CM | POA: Diagnosis not present

## 2020-03-19 DIAGNOSIS — K602 Anal fissure, unspecified: Secondary | ICD-10-CM | POA: Diagnosis not present

## 2020-03-19 DIAGNOSIS — Z0000 Encounter for general adult medical examination without abnormal findings: Secondary | ICD-10-CM

## 2020-03-19 MED ORDER — PRAVASTATIN SODIUM 40 MG PO TABS: 40.0000 mg | ORAL_TABLET | Freq: Every day | ORAL | 3 refills | Status: DC

## 2020-03-19 NOTE — Patient Instructions (Signed)

## 2020-03-20 LAB — COMPREHENSIVE METABOLIC PANEL
AG Ratio: 2.1 (calc) (ref 1.0–2.5)
ALT: 44 U/L — ABNORMAL HIGH (ref 6–29)
AST: 38 U/L — ABNORMAL HIGH (ref 10–35)
Albumin: 4.4 g/dL (ref 3.6–5.1)
Alkaline phosphatase (APISO): 109 U/L (ref 37–153)
BUN: 20 mg/dL (ref 7–25)
CO2: 25 mmol/L (ref 20–32)
Calcium: 9.5 mg/dL (ref 8.6–10.4)
Chloride: 102 mmol/L (ref 98–110)
Creat: 0.76 mg/dL (ref 0.50–1.05)
Globulin: 2.1 g/dL (calc) (ref 1.9–3.7)
Glucose, Bld: 91 mg/dL (ref 65–99)
Potassium: 3.5 mmol/L (ref 3.5–5.3)
Sodium: 139 mmol/L (ref 135–146)
Total Bilirubin: 0.4 mg/dL (ref 0.2–1.2)
Total Protein: 6.5 g/dL (ref 6.1–8.1)

## 2020-03-20 LAB — LIPID PANEL
Cholesterol: 165 mg/dL (ref <200)
HDL: 44 mg/dL — ABNORMAL LOW (ref >=50)
LDL Cholesterol (Calc): 101 mg/dL (calc) — ABNORMAL HIGH
Non-HDL Cholesterol (Calc): 121 mg/dL (calc) (ref <130)
Total CHOL/HDL Ratio: 3.8 (calc) (ref <5.0)
Triglycerides: 102 mg/dL (ref <150)

## 2020-03-20 LAB — CBC WITH DIFFERENTIAL/PLATELET
Absolute Monocytes: 427 cells/uL (ref 200–950)
Basophils Absolute: 12 cells/uL (ref 0–200)
Basophils Relative: 0.2 %
Eosinophils Absolute: 201 cells/uL (ref 15–500)
Eosinophils Relative: 3.3 %
HCT: 42.1 % (ref 35.0–45.0)
Hemoglobin: 13.4 g/dL (ref 11.7–15.5)
Lymphs Abs: 1574 cells/uL (ref 850–3900)
MCH: 27.1 pg (ref 27.0–33.0)
MCHC: 31.8 g/dL — ABNORMAL LOW (ref 32.0–36.0)
MCV: 85.1 fL (ref 80.0–100.0)
MPV: 12.2 fL (ref 7.5–12.5)
Monocytes Relative: 7 %
Neutro Abs: 3886 cells/uL (ref 1500–7800)
Neutrophils Relative %: 63.7 %
Platelets: 233 10*3/uL (ref 140–400)
RBC: 4.95 10*6/uL (ref 3.80–5.10)
RDW: 13 % (ref 11.0–15.0)
Total Lymphocyte: 25.8 %
WBC: 6.1 10*3/uL (ref 3.8–10.8)

## 2020-03-20 LAB — TSH: TSH: 0.14 mIU/L — ABNORMAL LOW (ref 0.40–4.50)

## 2020-03-21 NOTE — Progress Notes (Signed)
Subjective:     Tracy Mathews is a 54 y.o. female and is here for a comprehensive physical exam. The patient reports no problems.  Social History   Socioeconomic History  . Marital status: Married    Spouse name: Not on file  . Number of children: Not on file  . Years of education: Not on file  . Highest education level: Not on file  Occupational History  . Occupation: [employer]     Employer: [employer]  Tobacco Use  . Smoking status: Never Smoker  . Smokeless tobacco: Never Used  Substance and Sexual Activity  . Alcohol use: Yes    Alcohol/week: 0.0 standard drinks    Comment: occasionally; "a couple times monthly"  . Drug use: No  . Sexual activity: Yes    Partners: Male  Other Topics Concern  . Not on file  Social History Narrative   Exercising--no   Social Determinants of Health   Financial Resource Strain:   . Difficulty of Paying Living Expenses: Not on file  Food Insecurity:   . Worried About Charity fundraiser in the Last Year: Not on file  . Ran Out of Food in the Last Year: Not on file  Transportation Needs:   . Lack of Transportation (Medical): Not on file  . Lack of Transportation (Non-Medical): Not on file  Physical Activity:   . Days of Exercise per Week: Not on file  . Minutes of Exercise per Session: Not on file  Stress:   . Feeling of Stress : Not on file  Social Connections:   . Frequency of Communication with Friends and Family: Not on file  . Frequency of Social Gatherings with Friends and Family: Not on file  . Attends Religious Services: Not on file  . Active Member of Clubs or Organizations: Not on file  . Attends Archivist Meetings: Not on file  . Marital Status: Not on file  Intimate Partner Violence:   . Fear of Current or Ex-Partner: Not on file  . Emotionally Abused: Not on file  . Physically Abused: Not on file  . Sexually Abused: Not on file   Health Maintenance  Topic Date Due  . COVID-19 Vaccine (1) Never  done  . MAMMOGRAM  02/27/2018  . INFLUENZA VACCINE  12/21/2019  . PAP SMEAR-Modifier  03/22/2021  . TETANUS/TDAP  11/06/2021  . COLONOSCOPY  03/24/2025  . Hepatitis C Screening  Completed  . HIV Screening  Completed    The following portions of the patient's history were reviewed and updated as appropriate:  She  has a past medical history of Allergy, Arthritis, GERD (gastroesophageal reflux disease), Hemorrhoid, Hives, cardiovascular stress test, Hyperlipidemia, and Hypothyroidism. She does not have any pertinent problems on file. She  has a past surgical history that includes none; Trigger finger release (Right, 10/18/2012); wisdom teeth removal; and Ganglion cyst excision (Left, 02/17/2016). Her family history includes Arthritis in her father and mother; Diabetes in her brother and mother; Heart attack in her mother; Heart disease in her maternal uncle, maternal uncle, and maternal uncle; Heart disease (age of onset: 24) in her mother; Hyperlipidemia in her sister; Hypertension in her brother, brother, mother, and sister; Pulmonary fibrosis in her maternal uncle; Stroke in her father and mother. She  reports that she has never smoked. She has never used smokeless tobacco. She reports current alcohol use. She reports that she does not use drugs. She has a current medication list which includes the following  prescription(s): cetirizine, coq10, dexilant, ezetimibe, flax seed oil, glucosamine-chondroitin, hydrochlorothiazide, levothyroxine, potassium chloride sa, tramadol, vitamin b-12, and pravastatin. Current Outpatient Medications on File Prior to Visit  Medication Sig Dispense Refill  . cetirizine (ZYRTEC) 10 MG tablet Take 10 mg by mouth daily.      . Coenzyme Q10 (COQ10) 200 MG CAPS Take by mouth.    . dexlansoprazole (DEXILANT) 60 MG capsule Take 1 capsule (60 mg total) by mouth daily. 90 capsule 1  . ezetimibe (ZETIA) 10 MG tablet TAKE 1 TABLET BY MOUTH EVERY DAY 90 tablet 1  . Flaxseed,  Linseed, (FLAX SEED OIL) 1000 MG CAPS Take by mouth.    Marland Kitchen glucosamine-chondroitin 500-400 MG tablet Take 1 tablet by mouth daily.    . hydrochlorothiazide (HYDRODIURIL) 25 MG tablet Take 1 tablet (25 mg total) by mouth daily. 90 tablet 1  . levothyroxine (SYNTHROID) 137 MCG tablet TAKE 1 TABLET BY MOUTH EVERY DAY BEFORE BREAKFAST 90 tablet 1  . potassium chloride SA (KLOR-CON M20) 20 MEQ tablet Take 2 tablets (40 mEq total) by mouth daily. 180 tablet 1  . traMADol (ULTRAM) 50 MG tablet Take 1 tablet (50 mg total) by mouth every 6 (six) hours as needed. 30 tablet 0  . vitamin B-12 (CYANOCOBALAMIN) 1000 MCG tablet Take 1,000 mcg by mouth daily.     No current facility-administered medications on file prior to visit.   She is allergic to bactrim [sulfamethoxazole-trimethoprim], niacin and related, and simvastatin..  Review of Systems Review of Systems  Constitutional: Negative for activity change, appetite change and fatigue.  HENT: Negative for hearing loss, congestion, tinnitus and ear discharge.  dentist q27m Eyes: Negative for visual disturbance (see optho q1y -- vision corrected to 20/20 with glasses).  Respiratory: Negative for cough, chest tightness and shortness of breath.   Cardiovascular: Negative for chest pain, palpitations and leg swelling.  Gastrointestinal: Negative for abdominal pain, diarrhea, constipation and abdominal distention.  Genitourinary: Negative for urgency, frequency, decreased urine volume and difficulty urinating.  Musculoskeletal: Negative for back pain, arthralgias and gait problem.  Skin: Negative for color change, pallor and rash.  Neurological: Negative for dizziness, light-headedness, numbness and headaches.  Hematological: Negative for adenopathy. Does not bruise/bleed easily.  Psychiatric/Behavioral: Negative for suicidal ideas, confusion, sleep disturbance, self-injury, dysphoric mood, decreased concentration and agitation.       Objective:    BP  104/70 (BP Location: Right Arm, Patient Position: Sitting, Cuff Size: Normal)   Pulse 96   Temp 98.2 F (36.8 C) (Oral)   Resp 18   Ht 5\' 3"  (1.6 m)   Wt 217 lb (98.4 kg)   SpO2 98%   BMI 38.44 kg/m  General appearance: alert, cooperative, appears stated age and no distress Head: Normocephalic, without obvious abnormality, atraumatic Eyes: negative findings: lids and lashes normal, conjunctivae and sclerae normal and pupils equal, round, reactive to light and accomodation Ears: normal TM's and external ear canals both ears Neck: no adenopathy, no carotid bruit, no JVD, supple, symmetrical, trachea midline and thyroid not enlarged, symmetric, no tenderness/mass/nodules Back: symmetric, no curvature. ROM normal. No CVA tenderness. Lungs: clear to auscultation bilaterally Breasts: normal appearance, no masses or tenderness Heart: regular rate and rhythm, S1, S2 normal, no murmur, click, rub or gallop Abdomen: soft, non-tender; bowel sounds normal; no masses,  no organomegaly Pelvic: deferred Extremities: extremities normal, atraumatic, no cyanosis or edema Pulses: 2+ and symmetric Skin: Skin color, texture, turgor normal. No rashes or lesions Lymph nodes: Cervical, supraclavicular, and axillary nodes  normal. Neurologic: Alert and oriented X 3, normal strength and tone. Normal symmetric reflexes. Normal coordination and gait    Assessment:    Healthy female exam.      Plan:    ghm utd Check labs See After Visit Summary for Counseling Recommendations    1. Preventative health care See above  - Lipid panel - CBC with Differential/Platelet - TSH - Comprehensive metabolic panel  2. Hyperlipidemia, unspecified hyperlipidemia type Encouraged heart healthy diet, increase exercise, avoid trans fats, consider a krill oil cap daily - Lipid panel - CBC with Differential/Platelet - TSH - Comprehensive metabolic panel - pravastatin (PRAVACHOL) 40 MG tablet; Take 1 tablet (40 mg  total) by mouth daily.  Dispense: 90 tablet; Refill: 3  3. Anal fissure History of-- pt requesting referral back to GI - Ambulatory referral to Gastroenterology

## 2020-03-23 ENCOUNTER — Other Ambulatory Visit: Payer: Self-pay | Admitting: Family Medicine

## 2020-03-23 ENCOUNTER — Other Ambulatory Visit: Payer: Self-pay | Admitting: *Deleted

## 2020-03-23 DIAGNOSIS — E039 Hypothyroidism, unspecified: Secondary | ICD-10-CM

## 2020-03-23 DIAGNOSIS — R7989 Other specified abnormal findings of blood chemistry: Secondary | ICD-10-CM

## 2020-03-23 MED ORDER — LEVOTHYROXINE SODIUM 150 MCG PO TABS: 150.0000 ug | ORAL_TABLET | Freq: Every day | ORAL | 2 refills | Status: DC

## 2020-04-06 ENCOUNTER — Encounter: Payer: Self-pay | Admitting: Family Medicine

## 2020-04-07 ENCOUNTER — Other Ambulatory Visit: Payer: Self-pay | Admitting: Family Medicine

## 2020-04-07 ENCOUNTER — Other Ambulatory Visit: Payer: Self-pay | Admitting: *Deleted

## 2020-04-07 ENCOUNTER — Encounter: Payer: Self-pay | Admitting: *Deleted

## 2020-04-07 DIAGNOSIS — M25569 Pain in unspecified knee: Secondary | ICD-10-CM

## 2020-04-07 DIAGNOSIS — Z79899 Other long term (current) drug therapy: Secondary | ICD-10-CM

## 2020-04-07 MED ORDER — TRAMADOL HCL 50 MG PO TABS: 50.0000 mg | ORAL_TABLET | Freq: Four times a day (QID) | ORAL | 0 refills | Status: DC | PRN

## 2020-04-07 NOTE — Telephone Encounter (Signed)
Mychart message sent to patient.  We will collect at her lab visit on 05/26/20.

## 2020-04-07 NOTE — Telephone Encounter (Signed)
I will send in 1 month but we need uds and contract

## 2020-04-07 NOTE — Telephone Encounter (Signed)
Requesting: tramadol 50mg  Contract: None [id number]  Last Visit: 03/19/2020 Next Visit: 09/15/2020 Last Refill: 08/24/2016 #30 and 0RF Pt sig: 1 tab q6h prn  Please Advise

## 2020-04-17 ENCOUNTER — Other Ambulatory Visit: Payer: Self-pay | Admitting: Family Medicine

## 2020-04-17 DIAGNOSIS — R600 Localized edema: Secondary | ICD-10-CM

## 2020-04-21 DIAGNOSIS — K602 Anal fissure, unspecified: Secondary | ICD-10-CM | POA: Diagnosis not present

## 2020-05-22 ENCOUNTER — Other Ambulatory Visit: Payer: Self-pay | Admitting: Family Medicine

## 2020-05-22 DIAGNOSIS — K219 Gastro-esophageal reflux disease without esophagitis: Secondary | ICD-10-CM

## 2020-05-25 ENCOUNTER — Other Ambulatory Visit: Payer: Self-pay

## 2020-05-26 ENCOUNTER — Other Ambulatory Visit (INDEPENDENT_AMBULATORY_CARE_PROVIDER_SITE_OTHER): Payer: BC Managed Care – PPO

## 2020-05-26 DIAGNOSIS — R7989 Other specified abnormal findings of blood chemistry: Secondary | ICD-10-CM | POA: Diagnosis not present

## 2020-05-26 DIAGNOSIS — Z79899 Other long term (current) drug therapy: Secondary | ICD-10-CM

## 2020-05-26 DIAGNOSIS — M25569 Pain in unspecified knee: Secondary | ICD-10-CM

## 2020-05-26 DIAGNOSIS — E039 Hypothyroidism, unspecified: Secondary | ICD-10-CM | POA: Diagnosis not present

## 2020-05-26 LAB — COMPREHENSIVE METABOLIC PANEL
ALT: 68 U/L — ABNORMAL HIGH (ref 0–35)
AST: 62 U/L — ABNORMAL HIGH (ref 0–37)
Albumin: 4.3 g/dL (ref 3.5–5.2)
Alkaline Phosphatase: 103 U/L (ref 39–117)
BUN: 17 mg/dL (ref 6–23)
CO2: 28 mEq/L (ref 19–32)
Calcium: 9.5 mg/dL (ref 8.4–10.5)
Chloride: 102 mEq/L (ref 96–112)
Creatinine, Ser: 0.82 mg/dL (ref 0.40–1.20)
GFR: 79.66 mL/min (ref >60.00)
Glucose, Bld: 129 mg/dL — ABNORMAL HIGH (ref 70–99)
Potassium: 3.9 mEq/L (ref 3.5–5.1)
Sodium: 138 mEq/L (ref 135–145)
Total Bilirubin: 0.6 mg/dL (ref 0.2–1.2)
Total Protein: 6.6 g/dL (ref 6.0–8.3)

## 2020-05-26 LAB — TSH: TSH: 0.3 u[IU]/mL — ABNORMAL LOW (ref 0.35–4.50)

## 2020-05-28 LAB — DRUG MONITORING, PANEL 8 WITH CONFIRMATION, URINE
6 Acetylmorphine: NEGATIVE ng/mL (ref <10)
Alcohol Metabolites: NEGATIVE ng/mL
Amphetamines: NEGATIVE ng/mL (ref <500)
Benzodiazepines: NEGATIVE ng/mL (ref <100)
Buprenorphine, Urine: NEGATIVE ng/mL (ref <5)
Cocaine Metabolite: NEGATIVE ng/mL (ref <150)
Creatinine: 87.9 mg/dL
MDMA: NEGATIVE ng/mL (ref <500)
Marijuana Metabolite: NEGATIVE ng/mL (ref <20)
Opiates: NEGATIVE ng/mL (ref <100)
Oxidant: NEGATIVE ug/mL
Oxycodone: NEGATIVE ng/mL (ref <100)
pH: 7.2 (ref 4.5–9.0)

## 2020-05-28 LAB — DRUG MONITOR, TRAMADOL,QN, URINE
Desmethyltramadol: NEGATIVE ng/mL (ref <100)
Tramadol: NEGATIVE ng/mL (ref <100)

## 2020-05-28 LAB — DM TEMPLATE

## 2020-05-30 ENCOUNTER — Encounter: Payer: Self-pay | Admitting: Family Medicine

## 2020-05-30 DIAGNOSIS — E039 Hypothyroidism, unspecified: Secondary | ICD-10-CM

## 2020-05-31 ENCOUNTER — Other Ambulatory Visit: Payer: Self-pay | Admitting: Family Medicine

## 2020-05-31 DIAGNOSIS — R7989 Other specified abnormal findings of blood chemistry: Secondary | ICD-10-CM

## 2020-05-31 DIAGNOSIS — E039 Hypothyroidism, unspecified: Secondary | ICD-10-CM

## 2020-05-31 MED ORDER — LEVOTHYROXINE SODIUM 137 MCG PO TABS: 137.0000 ug | ORAL_TABLET | Freq: Every day | ORAL | 2 refills | Status: DC

## 2020-05-31 NOTE — Telephone Encounter (Signed)
Pt is hyperthyroid---  lower synthroid to 137 mcg daily #30 2 refills  Recheck tsh in 2 months  For liver function-- we need to order [place] abd to check the liver

## 2020-06-01 NOTE — Telephone Encounter (Signed)
Somehow the 150 was still on her list when I looked at her labs----   In that case d/c 137 and change to 125 mcg daily #30  2 refills

## 2020-06-02 DIAGNOSIS — K5900 Constipation, unspecified: Secondary | ICD-10-CM | POA: Diagnosis not present

## 2020-06-02 DIAGNOSIS — K602 Anal fissure, unspecified: Secondary | ICD-10-CM | POA: Diagnosis not present

## 2020-06-03 ENCOUNTER — Other Ambulatory Visit: Payer: Self-pay

## 2020-06-03 MED ORDER — LEVOTHYROXINE SODIUM 125 MCG PO TABS: 125.0000 ug | ORAL_TABLET | Freq: Every day | ORAL | 2 refills | Status: DC

## 2020-06-03 NOTE — Telephone Encounter (Signed)
Her tsh is low which means she is hyperthyroid so her dose needs to be decreased

## 2020-06-04 ENCOUNTER — Ambulatory Visit (HOSPITAL_BASED_OUTPATIENT_CLINIC_OR_DEPARTMENT_OTHER)
Admission: RE | Admit: 2020-06-04 | Discharge: 2020-06-04 | Disposition: A | Discharge status: Home / Self Care | Payer: BC Managed Care – PPO | Source: Ambulatory Visit | Attending: Family Medicine | Admitting: Family Medicine

## 2020-06-04 ENCOUNTER — Other Ambulatory Visit: Payer: Self-pay

## 2020-06-04 DIAGNOSIS — R7989 Other specified abnormal findings of blood chemistry: Secondary | ICD-10-CM | POA: Diagnosis not present

## 2020-06-07 ENCOUNTER — Other Ambulatory Visit: Payer: Self-pay | Admitting: Family Medicine

## 2020-06-07 ENCOUNTER — Encounter: Payer: Self-pay | Admitting: Family Medicine

## 2020-06-07 DIAGNOSIS — R748 Abnormal levels of other serum enzymes: Secondary | ICD-10-CM

## 2020-06-07 NOTE — Telephone Encounter (Signed)
Zyrtec and pravastatin could be doing it also----  are you taking the zyrtec daily?  Try holding the zyrtec and pravachol for 2 weeks and lets recheck in 2 weeks

## 2020-06-08 ENCOUNTER — Encounter: Payer: Self-pay | Admitting: Family Medicine

## 2020-06-08 DIAGNOSIS — K219 Gastro-esophageal reflux disease without esophagitis: Secondary | ICD-10-CM

## 2020-06-08 DIAGNOSIS — E785 Hyperlipidemia, unspecified: Secondary | ICD-10-CM

## 2020-06-09 MED ORDER — EZETIMIBE 10 MG PO TABS: ORAL_TABLET | ORAL | 1 refills | Status: DC

## 2020-06-09 MED ORDER — DEXLANSOPRAZOLE 60 MG PO CPDR: 60.0000 mg | DELAYED_RELEASE_CAPSULE | Freq: Every day | ORAL | 3 refills | Status: DC

## 2020-06-21 ENCOUNTER — Other Ambulatory Visit: Payer: Self-pay | Admitting: Family Medicine

## 2020-06-24 ENCOUNTER — Other Ambulatory Visit (INDEPENDENT_AMBULATORY_CARE_PROVIDER_SITE_OTHER): Payer: BC Managed Care – PPO

## 2020-06-24 ENCOUNTER — Other Ambulatory Visit: Payer: Self-pay

## 2020-06-24 DIAGNOSIS — R748 Abnormal levels of other serum enzymes: Secondary | ICD-10-CM

## 2020-06-24 DIAGNOSIS — E039 Hypothyroidism, unspecified: Secondary | ICD-10-CM | POA: Diagnosis not present

## 2020-06-24 LAB — COMPREHENSIVE METABOLIC PANEL
ALT: 48 U/L — ABNORMAL HIGH (ref 0–35)
AST: 33 U/L (ref 0–37)
Albumin: 4.2 g/dL (ref 3.5–5.2)
Alkaline Phosphatase: 102 U/L (ref 39–117)
BUN: 16 mg/dL (ref 6–23)
CO2: 29 mEq/L (ref 19–32)
Calcium: 9.6 mg/dL (ref 8.4–10.5)
Chloride: 103 mEq/L (ref 96–112)
Creatinine, Ser: 0.76 mg/dL (ref 0.40–1.20)
GFR: 87.21 mL/min (ref >60.00)
Glucose, Bld: 118 mg/dL — ABNORMAL HIGH (ref 70–99)
Potassium: 3.9 mEq/L (ref 3.5–5.1)
Sodium: 139 mEq/L (ref 135–145)
Total Bilirubin: 0.5 mg/dL (ref 0.2–1.2)
Total Protein: 6.6 g/dL (ref 6.0–8.3)

## 2020-06-24 LAB — TSH: TSH: 0.22 u[IU]/mL — ABNORMAL LOW (ref 0.35–4.50)

## 2020-06-27 ENCOUNTER — Other Ambulatory Visit: Payer: Self-pay | Admitting: Family Medicine

## 2020-06-27 DIAGNOSIS — E039 Hypothyroidism, unspecified: Secondary | ICD-10-CM

## 2020-06-29 ENCOUNTER — Other Ambulatory Visit: Payer: Self-pay

## 2020-06-29 DIAGNOSIS — M2241 Chondromalacia patellae, right knee: Secondary | ICD-10-CM | POA: Diagnosis not present

## 2020-06-29 MED ORDER — LEVOTHYROXINE SODIUM 112 MCG PO TABS: 112.0000 ug | ORAL_TABLET | Freq: Every day | ORAL | 2 refills | Status: DC

## 2020-07-16 DIAGNOSIS — M2392 Unspecified internal derangement of left knee: Secondary | ICD-10-CM | POA: Diagnosis not present

## 2020-07-16 DIAGNOSIS — M23304 Other meniscus derangements, unspecified medial meniscus, left knee: Secondary | ICD-10-CM | POA: Diagnosis not present

## 2020-07-16 DIAGNOSIS — M25562 Pain in left knee: Secondary | ICD-10-CM | POA: Diagnosis not present

## 2020-07-26 ENCOUNTER — Telehealth: Payer: Self-pay | Admitting: Family Medicine

## 2020-07-26 NOTE — Telephone Encounter (Signed)
She is able to schedule her mammogram without an order as long as its is just a screening mammogram

## 2020-07-26 NOTE — Telephone Encounter (Signed)
Pt requesting a screening mammogram. Please advise

## 2020-07-26 NOTE — Telephone Encounter (Signed)
Patient is requesting an order for a mammogram, patient would like test done a [facility]    Please advise

## 2020-07-27 ENCOUNTER — Other Ambulatory Visit: Payer: Self-pay | Admitting: Family Medicine

## 2020-07-27 DIAGNOSIS — Z1231 Encounter for screening mammogram for malignant neoplasm of breast: Secondary | ICD-10-CM

## 2020-07-27 NOTE — Telephone Encounter (Signed)
Pt says she called and was advised that her provider needed to place an order. She wants this done at [facility] in [place], [place].

## 2020-08-04 ENCOUNTER — Other Ambulatory Visit: Payer: BC Managed Care – PPO

## 2020-08-11 ENCOUNTER — Other Ambulatory Visit: Payer: Self-pay

## 2020-08-11 ENCOUNTER — Other Ambulatory Visit (INDEPENDENT_AMBULATORY_CARE_PROVIDER_SITE_OTHER): Payer: BC Managed Care – PPO

## 2020-08-11 DIAGNOSIS — E039 Hypothyroidism, unspecified: Secondary | ICD-10-CM | POA: Diagnosis not present

## 2020-08-11 LAB — TSH: TSH: 2.01 u[IU]/mL (ref 0.35–4.50)

## 2020-08-12 DIAGNOSIS — Z1231 Encounter for screening mammogram for malignant neoplasm of breast: Secondary | ICD-10-CM | POA: Diagnosis not present

## 2020-08-12 LAB — HM MAMMOGRAPHY

## 2020-08-29 ENCOUNTER — Other Ambulatory Visit: Payer: Self-pay | Admitting: Family Medicine

## 2020-09-15 ENCOUNTER — Telehealth: Payer: BC Managed Care – PPO | Admitting: Family Medicine

## 2020-09-20 ENCOUNTER — Other Ambulatory Visit: Payer: Self-pay | Admitting: Family Medicine

## 2020-09-20 ENCOUNTER — Telehealth: Payer: BC Managed Care – PPO | Admitting: Family Medicine

## 2020-10-04 DIAGNOSIS — M23304 Other meniscus derangements, unspecified medial meniscus, left knee: Secondary | ICD-10-CM | POA: Diagnosis not present

## 2020-10-12 DIAGNOSIS — M7122 Synovial cyst of popliteal space [Baker], left knee: Secondary | ICD-10-CM | POA: Diagnosis not present

## 2020-10-12 DIAGNOSIS — M23304 Other meniscus derangements, unspecified medial meniscus, left knee: Secondary | ICD-10-CM | POA: Diagnosis not present

## 2020-10-12 DIAGNOSIS — S83242A Other tear of medial meniscus, current injury, left knee, initial encounter: Secondary | ICD-10-CM | POA: Diagnosis not present

## 2020-10-12 DIAGNOSIS — M25462 Effusion, left knee: Secondary | ICD-10-CM | POA: Diagnosis not present

## 2020-10-12 DIAGNOSIS — M1712 Unilateral primary osteoarthritis, left knee: Secondary | ICD-10-CM | POA: Diagnosis not present

## 2020-10-15 DIAGNOSIS — M1712 Unilateral primary osteoarthritis, left knee: Secondary | ICD-10-CM | POA: Diagnosis not present

## 2020-10-15 DIAGNOSIS — M23322 Other meniscus derangements, posterior horn of medial meniscus, left knee: Secondary | ICD-10-CM | POA: Diagnosis not present

## 2020-10-15 DIAGNOSIS — M2392 Unspecified internal derangement of left knee: Secondary | ICD-10-CM | POA: Diagnosis not present

## 2020-11-01 ENCOUNTER — Other Ambulatory Visit: Payer: Self-pay | Admitting: Family Medicine

## 2020-11-01 DIAGNOSIS — R600 Localized edema: Secondary | ICD-10-CM

## 2020-11-05 DIAGNOSIS — Z01810 Encounter for preprocedural cardiovascular examination: Secondary | ICD-10-CM | POA: Diagnosis not present

## 2020-11-05 DIAGNOSIS — M23322 Other meniscus derangements, posterior horn of medial meniscus, left knee: Secondary | ICD-10-CM | POA: Diagnosis not present

## 2020-11-05 DIAGNOSIS — M23304 Other meniscus derangements, unspecified medial meniscus, left knee: Secondary | ICD-10-CM | POA: Diagnosis not present

## 2020-11-05 DIAGNOSIS — M25562 Pain in left knee: Secondary | ICD-10-CM | POA: Diagnosis not present

## 2020-11-05 DIAGNOSIS — M1712 Unilateral primary osteoarthritis, left knee: Secondary | ICD-10-CM | POA: Diagnosis not present

## 2020-11-05 DIAGNOSIS — Z01818 Encounter for other preprocedural examination: Secondary | ICD-10-CM | POA: Diagnosis not present

## 2020-11-10 DIAGNOSIS — Z20822 Contact with and (suspected) exposure to covid-19: Secondary | ICD-10-CM | POA: Diagnosis not present

## 2020-11-10 DIAGNOSIS — U071 COVID-19: Secondary | ICD-10-CM | POA: Diagnosis not present

## 2020-12-07 DIAGNOSIS — Z01818 Encounter for other preprocedural examination: Secondary | ICD-10-CM | POA: Diagnosis not present

## 2020-12-07 DIAGNOSIS — M23304 Other meniscus derangements, unspecified medial meniscus, left knee: Secondary | ICD-10-CM | POA: Diagnosis not present

## 2020-12-07 DIAGNOSIS — M23322 Other meniscus derangements, posterior horn of medial meniscus, left knee: Secondary | ICD-10-CM | POA: Diagnosis not present

## 2020-12-09 DIAGNOSIS — M23332 Other meniscus derangements, other medial meniscus, left knee: Secondary | ICD-10-CM | POA: Diagnosis not present

## 2020-12-09 DIAGNOSIS — M23222 Derangement of posterior horn of medial meniscus due to old tear or injury, left knee: Secondary | ICD-10-CM | POA: Diagnosis not present

## 2020-12-09 DIAGNOSIS — M23304 Other meniscus derangements, unspecified medial meniscus, left knee: Secondary | ICD-10-CM | POA: Diagnosis not present

## 2020-12-09 DIAGNOSIS — S83242A Other tear of medial meniscus, current injury, left knee, initial encounter: Secondary | ICD-10-CM | POA: Diagnosis not present

## 2020-12-09 DIAGNOSIS — M13862 Other specified arthritis, left knee: Secondary | ICD-10-CM | POA: Diagnosis not present

## 2020-12-09 DIAGNOSIS — M1712 Unilateral primary osteoarthritis, left knee: Secondary | ICD-10-CM | POA: Diagnosis not present

## 2020-12-09 HISTORY — PX: OTHER SURGICAL HISTORY: SHX169

## 2021-01-05 ENCOUNTER — Encounter: Payer: Self-pay | Admitting: Family Medicine

## 2021-01-05 DIAGNOSIS — E785 Hyperlipidemia, unspecified: Secondary | ICD-10-CM

## 2021-01-06 MED ORDER — LEVOTHYROXINE SODIUM 112 MCG PO TABS: 112.0000 ug | ORAL_TABLET | Freq: Every day | ORAL | 1 refills | Status: DC

## 2021-01-06 MED ORDER — EZETIMIBE 10 MG PO TABS: ORAL_TABLET | ORAL | 1 refills | Status: DC

## 2021-02-14 ENCOUNTER — Encounter: Payer: Self-pay | Admitting: Family Medicine

## 2021-02-14 ENCOUNTER — Other Ambulatory Visit: Payer: Self-pay | Admitting: Family Medicine

## 2021-02-14 DIAGNOSIS — R600 Localized edema: Secondary | ICD-10-CM

## 2021-02-14 DIAGNOSIS — K219 Gastro-esophageal reflux disease without esophagitis: Secondary | ICD-10-CM

## 2021-02-15 MED ORDER — DEXLANSOPRAZOLE 60 MG PO CPDR: 60.0000 mg | DELAYED_RELEASE_CAPSULE | Freq: Every day | ORAL | 0 refills | Status: DC

## 2021-02-15 MED ORDER — HYDROCHLOROTHIAZIDE 25 MG PO TABS: 25.0000 mg | ORAL_TABLET | Freq: Every day | ORAL | 0 refills | Status: DC

## 2021-02-16 ENCOUNTER — Other Ambulatory Visit: Payer: Self-pay | Admitting: Family Medicine

## 2021-02-16 DIAGNOSIS — K219 Gastro-esophageal reflux disease without esophagitis: Secondary | ICD-10-CM

## 2021-02-21 NOTE — Telephone Encounter (Signed)
Is there an alternative we can give pt of this medication?

## 2021-03-07 ENCOUNTER — Encounter: Payer: Self-pay | Admitting: Family Medicine

## 2021-03-07 ENCOUNTER — Ambulatory Visit: Payer: BC Managed Care – PPO | Admitting: Family Medicine

## 2021-03-07 ENCOUNTER — Other Ambulatory Visit: Payer: Self-pay

## 2021-03-07 VITALS — BP 120/88 | HR 91 | Temp 98.1°F | Resp 18 | Ht 63.0 in | Wt 216.0 lb

## 2021-03-07 DIAGNOSIS — K219 Gastro-esophageal reflux disease without esophagitis: Secondary | ICD-10-CM

## 2021-03-07 DIAGNOSIS — R739 Hyperglycemia, unspecified: Secondary | ICD-10-CM | POA: Diagnosis not present

## 2021-03-07 DIAGNOSIS — E785 Hyperlipidemia, unspecified: Secondary | ICD-10-CM | POA: Diagnosis not present

## 2021-03-07 DIAGNOSIS — D229 Melanocytic nevi, unspecified: Secondary | ICD-10-CM

## 2021-03-07 DIAGNOSIS — E039 Hypothyroidism, unspecified: Secondary | ICD-10-CM | POA: Diagnosis not present

## 2021-03-07 LAB — LIPID PANEL
Cholesterol: 240 mg/dL — ABNORMAL HIGH (ref 0–200)
HDL: 48.7 mg/dL (ref >39.00)
LDL Cholesterol: 157 mg/dL — ABNORMAL HIGH (ref 0–99)
NonHDL: 191.04
Total CHOL/HDL Ratio: 5
Triglycerides: 169 mg/dL — ABNORMAL HIGH (ref 0.0–149.0)
VLDL: 33.8 mg/dL (ref 0.0–40.0)

## 2021-03-07 LAB — COMPREHENSIVE METABOLIC PANEL
ALT: 29 U/L (ref 0–35)
AST: 22 U/L (ref 0–37)
Albumin: 4.3 g/dL (ref 3.5–5.2)
Alkaline Phosphatase: 93 U/L (ref 39–117)
BUN: 15 mg/dL (ref 6–23)
CO2: 26 mEq/L (ref 19–32)
Calcium: 9.7 mg/dL (ref 8.4–10.5)
Chloride: 102 mEq/L (ref 96–112)
Creatinine, Ser: 0.88 mg/dL (ref 0.40–1.20)
GFR: 72.78 mL/min (ref >60.00)
Glucose, Bld: 122 mg/dL — ABNORMAL HIGH (ref 70–99)
Potassium: 3.9 mEq/L (ref 3.5–5.1)
Sodium: 139 mEq/L (ref 135–145)
Total Bilirubin: 0.5 mg/dL (ref 0.2–1.2)
Total Protein: 6.5 g/dL (ref 6.0–8.3)

## 2021-03-07 LAB — HEMOGLOBIN A1C: Hgb A1c MFr Bld: 6.3 % (ref 4.6–6.5)

## 2021-03-07 LAB — TSH: TSH: 0.76 u[IU]/mL (ref 0.35–5.50)

## 2021-03-07 MED ORDER — OMEPRAZOLE 40 MG PO CPDR: 40.0000 mg | DELAYED_RELEASE_CAPSULE | Freq: Every day | ORAL | 3 refills | Status: DC

## 2021-03-07 MED ORDER — LEVOTHYROXINE SODIUM 112 MCG PO TABS: 112.0000 ug | ORAL_TABLET | Freq: Every day | ORAL | 1 refills | Status: DC

## 2021-03-07 NOTE — Patient Instructions (Signed)

## 2021-03-07 NOTE — Assessment & Plan Note (Signed)
Encourage heart healthy diet such as MIND or DASH diet, increase exercise, avoid trans fats, simple carbohydrates and processed foods, consider a krill or fish or flaxseed oil cap daily.  °

## 2021-03-07 NOTE — Progress Notes (Addendum)
Established Patient Office Visit  Subjective:  Patient ID: Tracy Mathews, female    DOB: 02/08/1966  Age: 55 y.o. MRN: [id number]  CC:  Chief Complaint  Patient presents with   Hyperlipidemia   Hypothyroidism   Follow-up    HPI Tracy Mathews presents for f/u cholesterol and thyroid.  She also c/o lesion on R forehead that will not heal.  It has been thee 5 months.   No other complaints  Past Medical History:  Diagnosis Date   Allergy    Arthritis    GERD (gastroesophageal reflux disease)    Hemorrhoid    Hives    chronic   Hx of cardiovascular stress test    ETT-Myoview (9/15):  no ischemia, EF 60%; low risk   Hyperlipidemia    Hypothyroidism     Past Surgical History:  Procedure Laterality Date   arthroscopic knee Left 12/09/2020   Dr Viona Gilmore Ermalinda Barrios-- [facility]   GANGLION CYST EXCISION Left 02/17/2016   [facility]   none     TRIGGER FINGER RELEASE Right 10/18/2012   [facility]   wisdom teeth removal      Family History  Problem Relation Age of Onset   Stroke Mother    Heart attack Mother    Hypertension Mother    Diabetes Mother    Heart disease Mother 57       chf, MI at 78 , stents   Arthritis Mother        ? RA   Stroke Father    Arthritis Father        ?RA   Hypertension Sister    Hypertension Brother    Diabetes Brother    Hypertension Brother    Hyperlipidemia Sister    Heart disease Maternal Uncle    Heart disease Maternal Uncle    Heart disease Maternal Uncle    Pulmonary fibrosis Maternal Uncle    Colon cancer Neg Hx    Esophageal cancer Neg Hx    Rectal cancer Neg Hx    Stomach cancer Neg Hx     Social History   Socioeconomic History   Marital status: Married    Spouse name: Not on file   Number of children: Not on file   Years of education: Not on file   Highest education level: Not on file  Occupational History   Occupation: [employer]     Employer: [employer]  Tobacco Use   Smoking status: Never   Smokeless tobacco: Never   Substance and Sexual Activity   Alcohol use: Yes    Alcohol/week: 0.0 standard drinks    Comment: occasionally; "a couple times monthly"   Drug use: No   Sexual activity: Yes    Partners: Male  Other Topics Concern   Not on file  Social History Narrative   Exercising--no   Social Determinants of Health   Financial Resource Strain: Not on file  Food Insecurity: Not on file  Transportation Needs: Not on file  Physical Activity: Not on file  Stress: Not on file  Social Connections: Not on file  Intimate Partner Violence: Not on file    Outpatient Medications Prior to Visit  Medication Sig Dispense Refill   cetirizine (ZYRTEC) 10 MG tablet Take 10 mg by mouth daily.       Coenzyme Q10 (COQ10) 200 MG CAPS Take by mouth.     ezetimibe (ZETIA) 10 MG tablet TAKE 1 TABLET BY MOUTH EVERY DAY 90 tablet 1   ezetimibe (  ZETIA) 10 MG tablet TAKE 1 TABLET BY MOUTH EVERY DAY 90 tablet 1   Flaxseed, Linseed, (FLAX SEED OIL) 1000 MG CAPS Take by mouth.     glucosamine-chondroitin 500-400 MG tablet Take 1 tablet by mouth daily.     hydrochlorothiazide (HYDRODIURIL) 25 MG tablet Take 1 tablet (25 mg total) by mouth daily. 30 tablet 0   KLOR-CON M20 20 MEQ tablet TAKE 2 TABLETS BY MOUTH EVERY DAY 180 tablet 1   pravastatin (PRAVACHOL) 40 MG tablet Take 1 tablet (40 mg total) by mouth daily. 90 tablet 3   traMADol (ULTRAM) 50 MG tablet Take 1 tablet (50 mg total) by mouth every 6 (six) hours as needed. 30 tablet 0   vitamin B-12 (CYANOCOBALAMIN) 1000 MCG tablet Take 1,000 mcg by mouth daily.     dexlansoprazole (DEXILANT) 60 MG capsule Take 1 capsule (60 mg total) by mouth daily. 30 capsule 0   levothyroxine (SYNTHROID) 112 MCG tablet Take 1 tablet (112 mcg total) by mouth daily. 90 tablet 1   No facility-administered medications prior to visit.    Allergies  Allergen Reactions   Bactrim [Sulfamethoxazole-Trimethoprim] Hives   Niacin And Related Nausea Only    Hot flashes per patient    Simvastatin     REACTION: cramps    ROS Review of Systems  Constitutional:  Negative for appetite change, diaphoresis, fatigue and unexpected weight change.  Eyes:  Negative for pain, redness and visual disturbance.  Respiratory:  Negative for cough, chest tightness, shortness of breath and wheezing.   Cardiovascular:  Negative for chest pain, palpitations and leg swelling.  Endocrine: Negative for cold intolerance, heat intolerance, polydipsia, polyphagia and polyuria.  Genitourinary:  Negative for difficulty urinating, dysuria and frequency.  Skin:  Positive for color change.  Neurological:  Negative for dizziness, light-headedness, numbness and headaches.     Objective:    Physical Exam Vitals and nursing note reviewed.  Constitutional:      Appearance: She is well-developed.  HENT:     Head: Normocephalic and atraumatic.  Eyes:     Conjunctiva/sclera: Conjunctivae normal.  Neck:     Thyroid: No thyromegaly.     Vascular: No carotid bruit or JVD.  Cardiovascular:     Rate and Rhythm: Normal rate and regular rhythm.     Heart sounds: Normal heart sounds. No murmur heard. Pulmonary:     Effort: Pulmonary effort is normal. No respiratory distress.     Breath sounds: Normal breath sounds. No wheezing or rales.  Chest:     Chest wall: No tenderness.  Musculoskeletal:     Cervical back: Normal range of motion and neck supple.  Skin:    Findings: Lesion present.     Comments: R temple---  dry scaly patch, +errythema, flat   Neurological:     Mental Status: She is alert and oriented to person, place, and time.   BP 120/88 (BP Location: Left Arm, Patient Position: Sitting, Cuff Size: Large)   Pulse 91   Temp 98.1 F (36.7 C) (Oral)   Resp 18   Ht 5\' 3"  (1.6 m)   Wt 216 lb (98 kg)   SpO2 96%   BMI 38.26 kg/m  Wt Readings from Last 3 Encounters:  03/07/21 216 lb (98 kg)  03/19/20 217 lb (98.4 kg)  11/05/19 217 lb (98.4 kg)     Health Maintenance Due  Topic Date  Due   PAP SMEAR-Modifier  03/22/2021    There are no preventive care reminders  to display for this patient.  Lab Results  Component Value Date   TSH 2.01 08/11/2020   Lab Results  Component Value Date   WBC 6.1 03/19/2020   HGB 13.4 03/19/2020   HCT 42.1 03/19/2020   MCV 85.1 03/19/2020   PLT 233 03/19/2020   Lab Results  Component Value Date   NA 139 06/24/2020   K 3.9 06/24/2020   CO2 29 06/24/2020   GLUCOSE 118 (H) 06/24/2020   BUN 16 06/24/2020   CREATININE 0.76 06/24/2020   BILITOT 0.5 06/24/2020   ALKPHOS 102 06/24/2020   AST 33 06/24/2020   ALT 48 (H) 06/24/2020   PROT 6.6 06/24/2020   ALBUMIN 4.2 06/24/2020   CALCIUM 9.6 06/24/2020   GFR 87.21 06/24/2020   Lab Results  Component Value Date   CHOL 165 03/19/2020   Lab Results  Component Value Date   HDL 44 (L) 03/19/2020   Lab Results  Component Value Date   LDLCALC 101 (H) 03/19/2020   Lab Results  Component Value Date   TRIG 102 03/19/2020   Lab Results  Component Value Date   CHOLHDL 3.8 03/19/2020   Lab Results  Component Value Date   HGBA1C 5.8 01/10/2013      Assessment & Plan:   Problem List Items Addressed This Visit       Unprioritized   Gastroesophageal reflux disease    Her ins will not pay for dexilant Omeprazole sent in Call or rto if symptoms do not improve      Relevant Medications   omeprazole (PRILOSEC) 40 MG capsule   Hyperglycemia    Check labs , a1c Watch simple sugars and starches      Relevant Orders   Hemoglobin A1c   Hyperlipidemia - Primary    Encourage heart healthy diet such as MIND or DASH diet, increase exercise, avoid trans fats, simple carbohydrates and processed foods, consider a krill or fish or flaxseed oil cap daily.       Relevant Orders   Lipid panel   Comprehensive metabolic panel   Hypothyroidism    Check labs  con't synthroid      Relevant Medications   levothyroxine (SYNTHROID) 112 MCG tablet   Other Relevant Orders   TSH    Other Visit Diagnoses     Suspicious nevus       Relevant Orders   Ambulatory referral to Dermatology       Meds ordered this encounter  Medications   DISCONTD: levothyroxine (SYNTHROID) 112 MCG tablet    Sig: Take 1 tablet (112 mcg total) by mouth daily.    Dispense:  90 tablet    Refill:  1   omeprazole (PRILOSEC) 40 MG capsule    Sig: Take 1 capsule (40 mg total) by mouth daily.    Dispense:  90 capsule    Refill:  3   levothyroxine (SYNTHROID) 112 MCG tablet    Sig: Take 1 tablet (112 mcg total) by mouth daily.    Dispense:  90 tablet    Refill:  1    Follow-up: Return in about 6 months (around 09/05/2021) for annual exam, fasting.    Ann Held, DO

## 2021-03-07 NOTE — Assessment & Plan Note (Signed)
Her ins will not pay for dexilant Omeprazole sent in Call or rto if symptoms do not improve

## 2021-03-07 NOTE — Assessment & Plan Note (Signed)
Check labs , a1c Watch simple sugars and starches

## 2021-03-07 NOTE — Assessment & Plan Note (Signed)
Check labs con't synthroid 

## 2021-03-10 ENCOUNTER — Other Ambulatory Visit: Payer: Self-pay | Admitting: Family Medicine

## 2021-03-10 DIAGNOSIS — R600 Localized edema: Secondary | ICD-10-CM

## 2021-03-15 DIAGNOSIS — G44219 Episodic tension-type headache, not intractable: Secondary | ICD-10-CM | POA: Diagnosis not present

## 2021-04-13 ENCOUNTER — Other Ambulatory Visit: Payer: Self-pay | Admitting: Family Medicine

## 2021-04-13 DIAGNOSIS — R600 Localized edema: Secondary | ICD-10-CM

## 2021-04-18 ENCOUNTER — Encounter: Payer: Self-pay | Admitting: Family Medicine

## 2021-04-18 DIAGNOSIS — E785 Hyperlipidemia, unspecified: Secondary | ICD-10-CM

## 2021-04-18 MED ORDER — PRAVASTATIN SODIUM 40 MG PO TABS: 40.0000 mg | ORAL_TABLET | Freq: Every day | ORAL | 1 refills | Status: DC

## 2021-04-18 MED ORDER — EZETIMIBE 10 MG PO TABS: ORAL_TABLET | ORAL | 1 refills | Status: DC

## 2021-04-27 DIAGNOSIS — D485 Neoplasm of uncertain behavior of skin: Secondary | ICD-10-CM | POA: Diagnosis not present

## 2021-04-27 DIAGNOSIS — L309 Dermatitis, unspecified: Secondary | ICD-10-CM | POA: Diagnosis not present

## 2021-05-04 DIAGNOSIS — K625 Hemorrhage of anus and rectum: Secondary | ICD-10-CM | POA: Diagnosis not present

## 2021-05-04 DIAGNOSIS — L309 Dermatitis, unspecified: Secondary | ICD-10-CM | POA: Diagnosis not present

## 2021-05-04 DIAGNOSIS — K602 Anal fissure, unspecified: Secondary | ICD-10-CM | POA: Diagnosis not present

## 2021-05-04 DIAGNOSIS — Z8719 Personal history of other diseases of the digestive system: Secondary | ICD-10-CM | POA: Diagnosis not present

## 2021-05-04 DIAGNOSIS — K6289 Other specified diseases of anus and rectum: Secondary | ICD-10-CM | POA: Diagnosis not present

## 2021-05-04 DIAGNOSIS — D485 Neoplasm of uncertain behavior of skin: Secondary | ICD-10-CM | POA: Diagnosis not present

## 2021-05-06 ENCOUNTER — Other Ambulatory Visit: Payer: Self-pay | Admitting: Family Medicine

## 2021-05-06 ENCOUNTER — Encounter: Payer: Self-pay | Admitting: Family Medicine

## 2021-05-06 DIAGNOSIS — Z862 Personal history of diseases of the blood and blood-forming organs and certain disorders involving the immune mechanism: Secondary | ICD-10-CM

## 2021-05-09 ENCOUNTER — Other Ambulatory Visit: Payer: Self-pay | Admitting: Family Medicine

## 2021-05-09 MED ORDER — TRAMADOL HCL 50 MG PO TABS: 50.0000 mg | ORAL_TABLET | Freq: Four times a day (QID) | ORAL | 0 refills | Status: AC | PRN

## 2021-05-11 ENCOUNTER — Other Ambulatory Visit: Payer: Self-pay

## 2021-05-11 ENCOUNTER — Ambulatory Visit (HOSPITAL_BASED_OUTPATIENT_CLINIC_OR_DEPARTMENT_OTHER)
Admission: RE | Admit: 2021-05-11 | Discharge: 2021-05-11 | Disposition: A | Discharge status: Home / Self Care | Payer: BC Managed Care – PPO | Source: Ambulatory Visit | Attending: Family Medicine | Admitting: Family Medicine

## 2021-05-11 DIAGNOSIS — D869 Sarcoidosis, unspecified: Secondary | ICD-10-CM | POA: Diagnosis not present

## 2021-05-11 DIAGNOSIS — R918 Other nonspecific abnormal finding of lung field: Secondary | ICD-10-CM | POA: Diagnosis not present

## 2021-05-11 DIAGNOSIS — Z862 Personal history of diseases of the blood and blood-forming organs and certain disorders involving the immune mechanism: Secondary | ICD-10-CM | POA: Diagnosis not present

## 2021-06-16 DIAGNOSIS — K625 Hemorrhage of anus and rectum: Secondary | ICD-10-CM | POA: Diagnosis not present

## 2021-06-16 DIAGNOSIS — K635 Polyp of colon: Secondary | ICD-10-CM | POA: Diagnosis not present

## 2021-06-16 DIAGNOSIS — R194 Change in bowel habit: Secondary | ICD-10-CM | POA: Diagnosis not present

## 2021-06-16 DIAGNOSIS — K5140 Inflammatory polyps of colon without complications: Secondary | ICD-10-CM | POA: Diagnosis not present

## 2021-06-16 DIAGNOSIS — K648 Other hemorrhoids: Secondary | ICD-10-CM | POA: Diagnosis not present

## 2021-06-16 HISTORY — PX: RECTAL SURGERY: SHX760

## 2021-06-28 DIAGNOSIS — K601 Chronic anal fissure: Secondary | ICD-10-CM | POA: Diagnosis not present

## 2021-07-01 DIAGNOSIS — K624 Stenosis of anus and rectum: Secondary | ICD-10-CM | POA: Diagnosis not present

## 2021-07-01 DIAGNOSIS — Z79890 Hormone replacement therapy (postmenopausal): Secondary | ICD-10-CM | POA: Diagnosis not present

## 2021-07-01 DIAGNOSIS — Z881 Allergy status to other antibiotic agents status: Secondary | ICD-10-CM | POA: Diagnosis not present

## 2021-07-01 DIAGNOSIS — M1990 Unspecified osteoarthritis, unspecified site: Secondary | ICD-10-CM | POA: Diagnosis not present

## 2021-07-01 DIAGNOSIS — E079 Disorder of thyroid, unspecified: Secondary | ICD-10-CM | POA: Diagnosis not present

## 2021-07-01 DIAGNOSIS — Z888 Allergy status to other drugs, medicaments and biological substances status: Secondary | ICD-10-CM | POA: Diagnosis not present

## 2021-07-01 DIAGNOSIS — Z79899 Other long term (current) drug therapy: Secondary | ICD-10-CM | POA: Diagnosis not present

## 2021-07-01 DIAGNOSIS — K602 Anal fissure, unspecified: Secondary | ICD-10-CM | POA: Diagnosis not present

## 2021-07-01 DIAGNOSIS — K219 Gastro-esophageal reflux disease without esophagitis: Secondary | ICD-10-CM | POA: Diagnosis not present

## 2021-07-21 ENCOUNTER — Encounter: Payer: Self-pay | Admitting: Family Medicine

## 2021-07-21 DIAGNOSIS — R600 Localized edema: Secondary | ICD-10-CM

## 2021-07-22 MED ORDER — HYDROCHLOROTHIAZIDE 25 MG PO TABS: 25.0000 mg | ORAL_TABLET | Freq: Every day | ORAL | 0 refills | Status: DC

## 2021-07-22 MED ORDER — POTASSIUM CHLORIDE CRYS ER 20 MEQ PO TBCR: 40.0000 meq | EXTENDED_RELEASE_TABLET | Freq: Every day | ORAL | 0 refills | Status: DC

## 2021-09-02 IMAGING — US US ABDOMEN LIMITED
1 series · 14 of 25 positions shown · non-contrast
Comparison: None.

CLINICAL DATA: Elevated liver function

EXAM:
ULTRASOUND ABDOMEN LIMITED RIGHT UPPER QUADRANT

[Series 1: us abdomen limited · 14 of 31 slices shown]
[im 1/31]
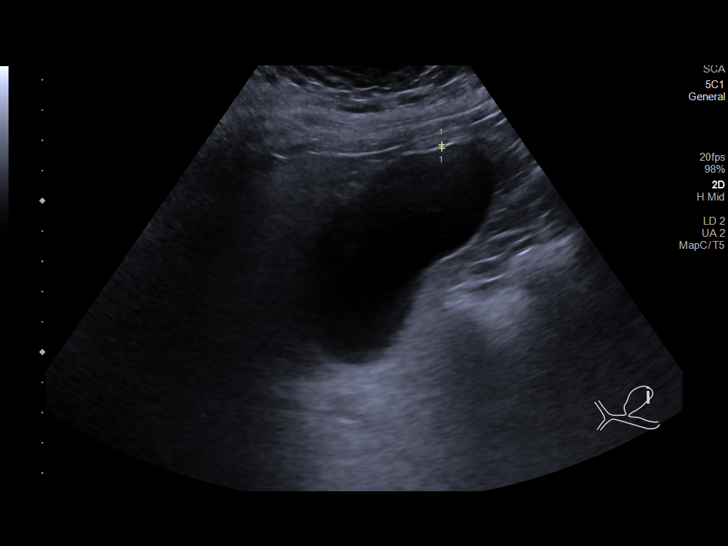
[im 3/31]
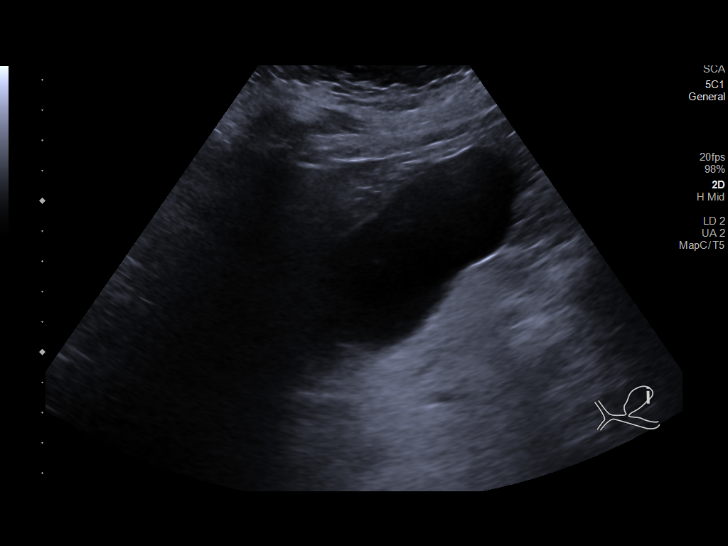
[im 6/31]
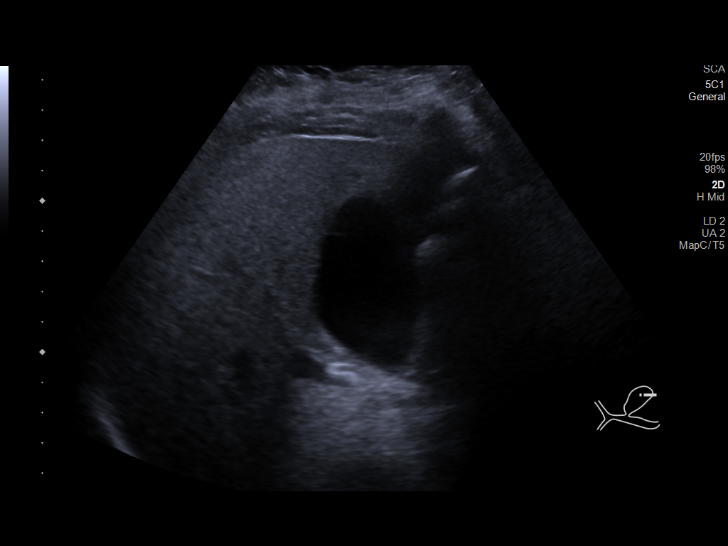
[im 8/31]
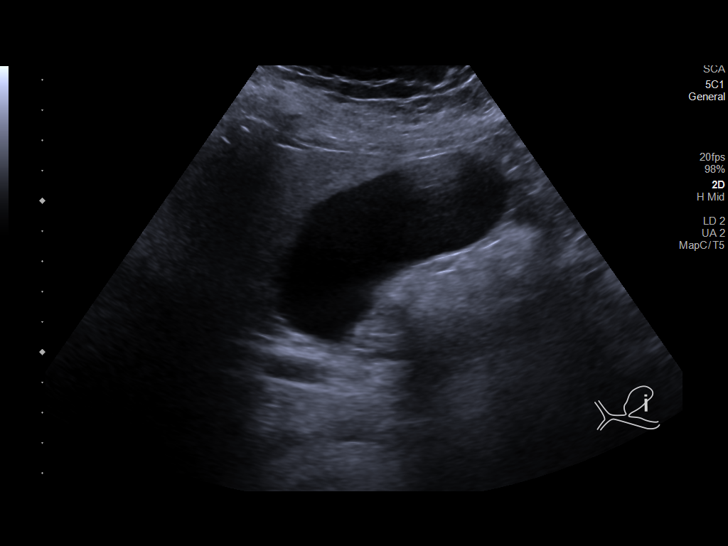
[im 11/31]
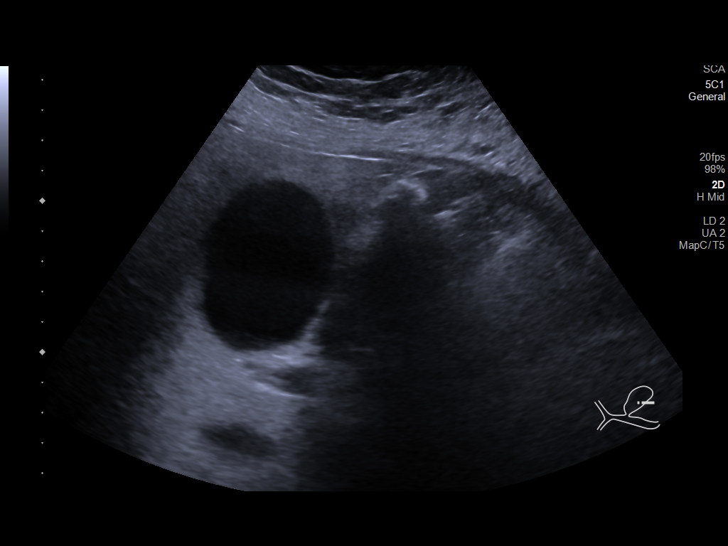
[im 12/31]
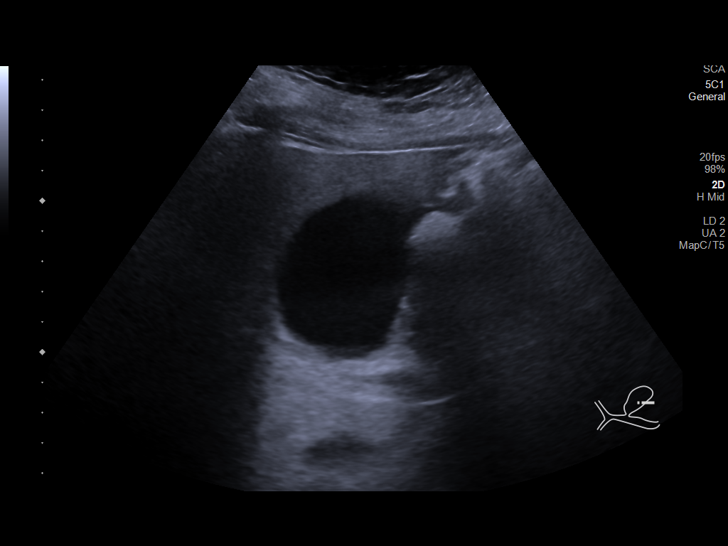
[im 14/31]
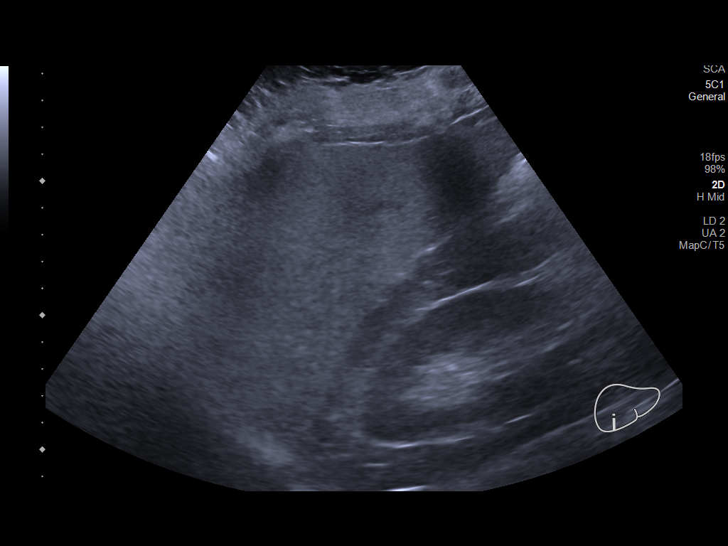
[im 17/31]
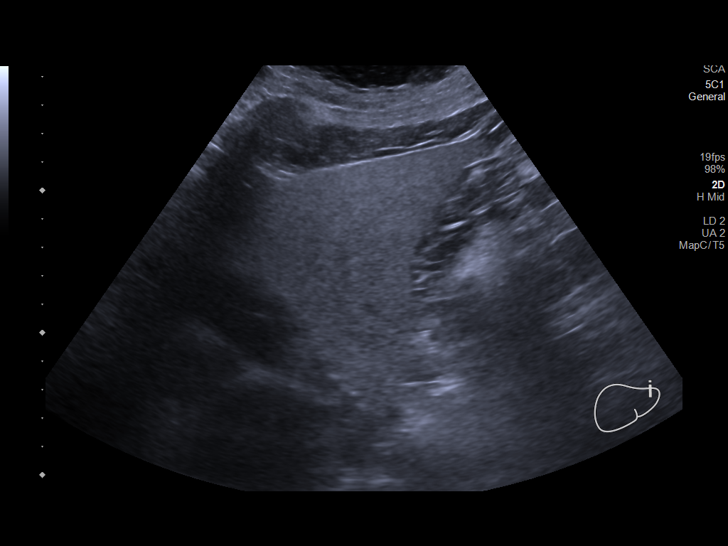
[im 19/31]
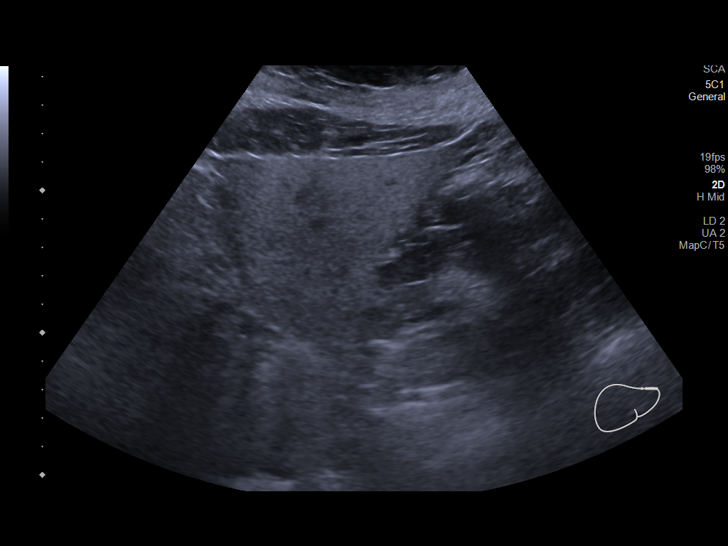
[im 21/31]
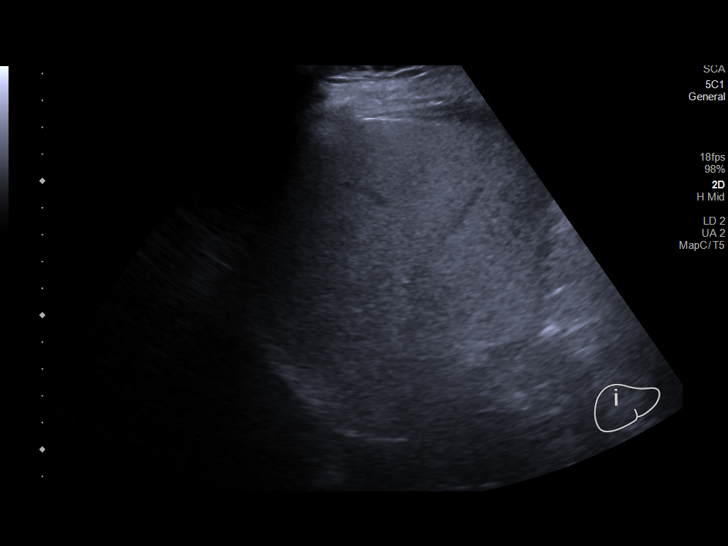
[im 23/31]
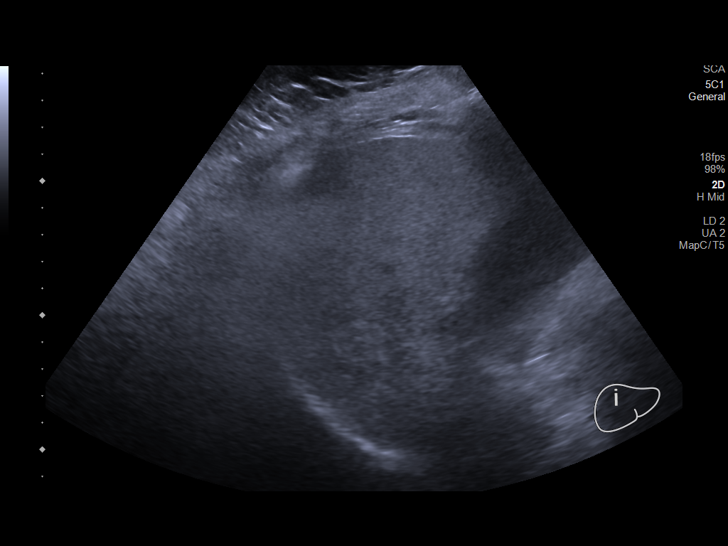
[im 26/31]
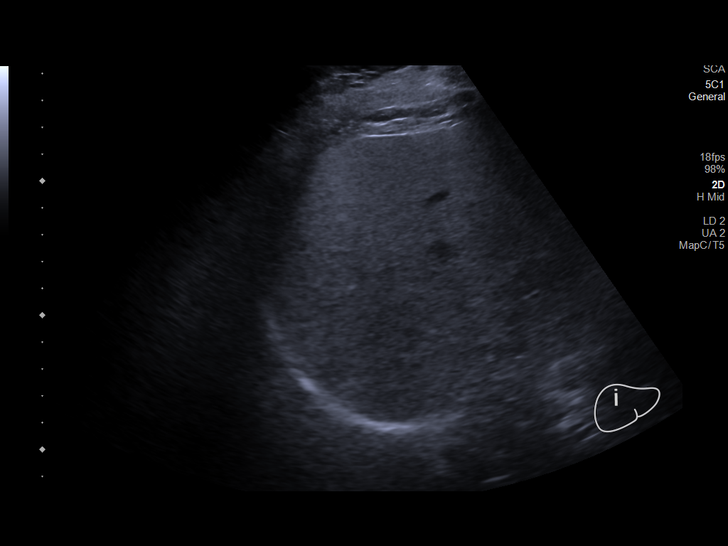
[im 28/31]
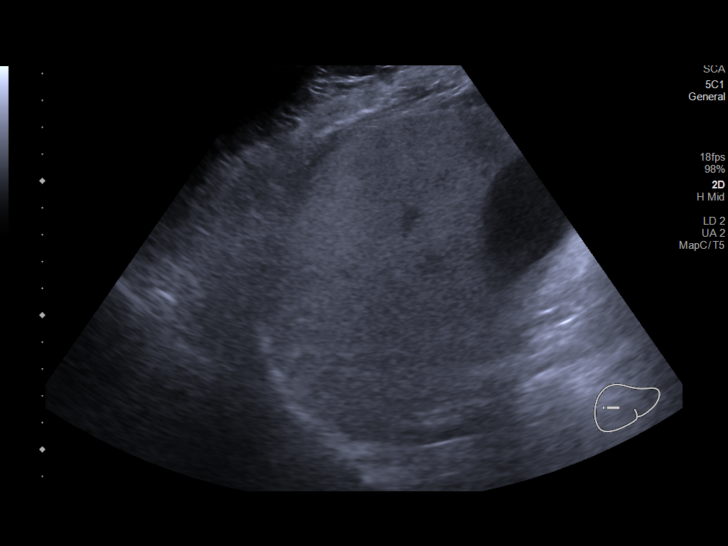
[im 31/31]
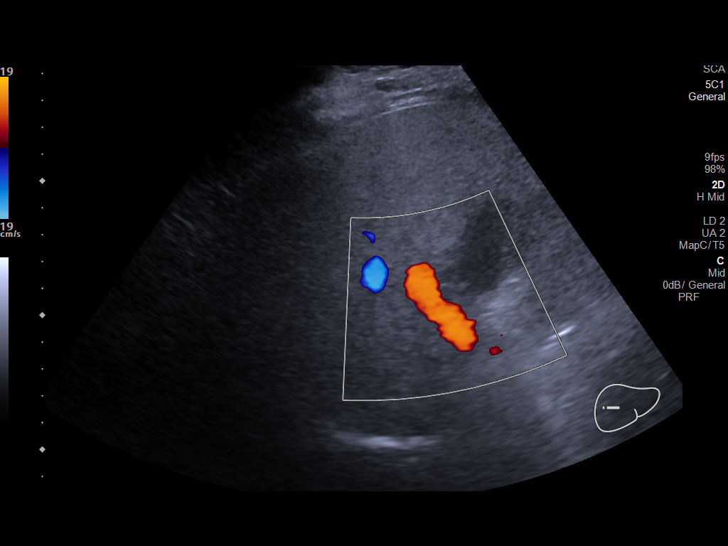

[14 of 25 positions shown; findings below may reference images not displayed]

FINDINGS: Gallbladder:

No gallstones or wall thickening visualized. No sonographic Murphy
sign noted by sonographer.

Common bile duct:

Diameter: 4 mm

Liver:

Diffusely increased echogenicity of hepatic parenchyma. Portal vein
is patent on color Doppler imaging with normal direction of blood
flow towards the liver.

Other: None.
IMPRESSION: Diffuse increased echogenicity of the hepatic parenchyma is a
nonspecific indicator of hepatocellular dysfunction, most commonly
steatosis.

## 2021-09-13 ENCOUNTER — Encounter: Payer: BC Managed Care – PPO | Admitting: Family Medicine

## 2021-09-13 ENCOUNTER — Ambulatory Visit (INDEPENDENT_AMBULATORY_CARE_PROVIDER_SITE_OTHER): Payer: BC Managed Care – PPO | Admitting: Family Medicine

## 2021-09-13 ENCOUNTER — Encounter: Payer: Self-pay | Admitting: Family Medicine

## 2021-09-13 VITALS — BP 120/80 | HR 105 | Temp 97.8°F | Resp 18 | Ht 63.0 in | Wt 212.4 lb

## 2021-09-13 DIAGNOSIS — E538 Deficiency of other specified B group vitamins: Secondary | ICD-10-CM | POA: Diagnosis not present

## 2021-09-13 DIAGNOSIS — E039 Hypothyroidism, unspecified: Secondary | ICD-10-CM | POA: Diagnosis not present

## 2021-09-13 DIAGNOSIS — R079 Chest pain, unspecified: Secondary | ICD-10-CM

## 2021-09-13 DIAGNOSIS — M542 Cervicalgia: Secondary | ICD-10-CM

## 2021-09-13 DIAGNOSIS — I10 Essential (primary) hypertension: Secondary | ICD-10-CM | POA: Diagnosis not present

## 2021-09-13 DIAGNOSIS — Z0000 Encounter for general adult medical examination without abnormal findings: Secondary | ICD-10-CM | POA: Insufficient documentation

## 2021-09-13 DIAGNOSIS — R5383 Other fatigue: Secondary | ICD-10-CM

## 2021-09-13 DIAGNOSIS — E785 Hyperlipidemia, unspecified: Secondary | ICD-10-CM

## 2021-09-13 DIAGNOSIS — R000 Tachycardia, unspecified: Secondary | ICD-10-CM

## 2021-09-13 LAB — CBC WITH DIFFERENTIAL/PLATELET
Basophils Absolute: 0 10*3/uL (ref 0.0–0.1)
Basophils Relative: 0.5 % (ref 0.0–3.0)
Eosinophils Absolute: 0.2 10*3/uL (ref 0.0–0.7)
Eosinophils Relative: 2.8 % (ref 0.0–5.0)
HCT: 41.5 % (ref 36.0–46.0)
Hemoglobin: 13.5 g/dL (ref 12.0–15.0)
Lymphocytes Relative: 26.9 % (ref 12.0–46.0)
Lymphs Abs: 1.5 10*3/uL (ref 0.7–4.0)
MCHC: 32.7 g/dL (ref 30.0–36.0)
MCV: 85.2 fl (ref 78.0–100.0)
Monocytes Absolute: 0.4 10*3/uL (ref 0.1–1.0)
Monocytes Relative: 7.7 % (ref 3.0–12.0)
Neutro Abs: 3.4 10*3/uL (ref 1.4–7.7)
Neutrophils Relative %: 62.1 % (ref 43.0–77.0)
Platelets: 208 10*3/uL (ref 150.0–400.0)
RBC: 4.87 Mil/uL (ref 3.87–5.11)
RDW: 14 % (ref 11.5–15.5)
WBC: 5.5 10*3/uL (ref 4.0–10.5)

## 2021-09-13 LAB — VITAMIN B12: Vitamin B-12: 332 pg/mL (ref 211–911)

## 2021-09-13 LAB — COMPREHENSIVE METABOLIC PANEL
ALT: 28 U/L (ref 0–35)
AST: 22 U/L (ref 0–37)
Albumin: 4.3 g/dL (ref 3.5–5.2)
Alkaline Phosphatase: 97 U/L (ref 39–117)
BUN: 18 mg/dL (ref 6–23)
CO2: 27 mEq/L (ref 19–32)
Calcium: 9.4 mg/dL (ref 8.4–10.5)
Chloride: 102 mEq/L (ref 96–112)
Creatinine, Ser: 0.79 mg/dL (ref 0.40–1.20)
GFR: 82.54 mL/min (ref >60.00)
Glucose, Bld: 112 mg/dL — ABNORMAL HIGH (ref 70–99)
Potassium: 3.9 mEq/L (ref 3.5–5.1)
Sodium: 139 mEq/L (ref 135–145)
Total Bilirubin: 0.5 mg/dL (ref 0.2–1.2)
Total Protein: 6.6 g/dL (ref 6.0–8.3)

## 2021-09-13 LAB — LIPID PANEL
Cholesterol: 229 mg/dL — ABNORMAL HIGH (ref 0–200)
HDL: 51.5 mg/dL (ref >39.00)
LDL Cholesterol: 141 mg/dL — ABNORMAL HIGH (ref 0–99)
NonHDL: 177.82
Total CHOL/HDL Ratio: 4
Triglycerides: 184 mg/dL — ABNORMAL HIGH (ref 0.0–149.0)
VLDL: 36.8 mg/dL (ref 0.0–40.0)

## 2021-09-13 LAB — VITAMIN D 25 HYDROXY (VIT D DEFICIENCY, FRACTURES): VITD: <7 ng/mL — ABNORMAL LOW (ref 30.00–100.00)

## 2021-09-13 LAB — TSH: TSH: 1.19 u[IU]/mL (ref 0.35–5.50)

## 2021-09-13 MED ORDER — LEVOTHYROXINE SODIUM 112 MCG PO TABS: 112.0000 ug | ORAL_TABLET | Freq: Every day | ORAL | 1 refills | Status: DC

## 2021-09-13 NOTE — Patient Instructions (Signed)

## 2021-09-13 NOTE — Assessment & Plan Note (Signed)
ghm utd Check labs  See avs  

## 2021-09-13 NOTE — Assessment & Plan Note (Signed)
con't synthroid Check labs  

## 2021-09-13 NOTE — Progress Notes (Signed)
? ?Subjective:  ? ?By signing my name below, I, Shehryar Baig, attest that this documentation has been prepared under the direction and in the presence of Ann Held, DO  09/13/2021 ?  ? ? Patient ID: Tracy Mathews, female    DOB: 1965-11-09, 56 y.o.   MRN: [id number] ? ?Chief Complaint  ?Patient presents with  ? Annual Exam  ?  Pt states fasting   ? ? ?HPI ?Patient is in today for a comprehensive physical exam.  ? ?She is requesting a refill on 112 mcg synthroid daily PO.  ?She reports having foot pain. She has a history of arthritis and also reports having a meniscus repair procedure on the same leg last year. She is planning on scheduling an appointment with her orthopedist specialist to manage her symptoms.  ?She continues having neck pain and is planning on seeing an orthopedist to manage her symptoms. She has a history of pinched nerve that has previously given her neck pain.  ?She has seen a dermatologist and reports finding sarcoidosis. She had an xray on her lungs to find if it spread and her results were normal.  ?Her heart rate is elevated during this visit. She reports her sister is diagnosed with uterine cancer and her foot pain might be contributing to her increased heart rate. She also reports having mild and occasional chest pain and thinks it may be due to stress. ?BP Readings from Last 3 Encounters:  ?09/13/21 120/80  ?03/07/21 120/88  ?03/19/20 104/70  ? ?Pulse Readings from Last 3 Encounters:  ?09/13/21 (!) 105  ?03/07/21 91  ?03/19/20 96  ? ?She denies having any fever, ear pain, muscle pain, new joint pain, new moles, congestion, sinus pain, sore throat, eye pain, palpations, cough, SOB, wheezing, n/v/d, constipation, blood in stool, dysuria, frequency, hematuria, or headaches at this time. ?Her sister was diagnosed with uterine cancer, otherwise she has no change in her family medical history. She had meniscus repair procedure last year, a procedure to stretch her anus on January  2023,  otherwise she has no other surgical procedures in the past year.  ?She does not have any Covid-19 vaccines. She is not interested in receiving the shingles vaccines.  ?She is not exercising regularly due to pain in her foot and knee.  ?She is UTD on vision care.  ?She is due for dental care.  ? ? ?Past Medical History:  ?Diagnosis Date  ? Allergy   ? Arthritis   ? GERD (gastroesophageal reflux disease)   ? Hemorrhoid   ? Hives   ? chronic  ? Hx of cardiovascular stress test   ? ETT-Myoview (9/15):  no ischemia, EF 60%; low risk  ? Hyperlipidemia   ? Hypothyroidism   ? ? ?Past Surgical History:  ?Procedure Laterality Date  ? arthroscopic knee Left 12/09/2020  ? Dr Bing Neighbors-- [facility]  ? GANGLION CYST EXCISION Left 02/17/2016  ? [facility]  ? none    ? RECTAL SURGERY N/A 06/16/2021  ? Lavone Neri, [facility]-- [facility]  ? TRIGGER FINGER RELEASE Right 10/18/2012  ? [facility]  ? wisdom teeth removal    ? ? ?Family History  ?Problem Relation Age of Onset  ? Stroke Mother   ? Heart attack Mother   ? Hypertension Mother   ? Diabetes Mother   ? Heart disease Mother 44  ?     chf, MI at 42 , stents  ? Arthritis Mother   ?     ? RA  ?  Stroke Father   ? Arthritis Father   ?     ?RA  ? Cancer Sister   ? Hypertension Sister   ? Hyperlipidemia Sister   ? Hypertension Brother   ? Diabetes Brother   ? Hypertension Brother   ? Heart disease Maternal Uncle   ? Heart disease Maternal Uncle   ? Heart disease Maternal Uncle   ? Pulmonary fibrosis Maternal Uncle   ? Colon cancer Neg Hx   ? Esophageal cancer Neg Hx   ? Rectal cancer Neg Hx   ? Stomach cancer Neg Hx   ? ? ?Social History  ? ?Socioeconomic History  ? Marital status: Married  ?  Spouse name: Not on file  ? Number of children: Not on file  ? Years of education: Not on file  ? Highest education level: Not on file  ?Occupational History  ? Occupation: [employer]   ?  Employer: [employer]  ?Tobacco Use  ? Smoking status: Never  ? Smokeless tobacco: Never  ?Substance and Sexual  Activity  ? Alcohol use: Yes  ?  Alcohol/week: 0.0 standard drinks  ?  Comment: occasionally; "a couple times monthly"  ? Drug use: No  ? Sexual activity: Yes  ?  Partners: Male  ?Other Topics Concern  ? Not on file  ?Social History Narrative  ? Exercising--no  ? ?Social Determinants of Health  ? ?Financial Resource Strain: Not on file  ?Food Insecurity: Not on file  ?Transportation Needs: Not on file  ?Physical Activity: Not on file  ?Stress: Not on file  ?Social Connections: Not on file  ?Intimate Partner Violence: Not on file  ? ? ?Outpatient Medications Prior to Visit  ?Medication Sig Dispense Refill  ? cetirizine (ZYRTEC) 10 MG tablet Take 10 mg by mouth daily.      ? Coenzyme Q10 (COQ10) 200 MG CAPS Take by mouth.    ? ezetimibe (ZETIA) 10 MG tablet TAKE 1 TABLET BY MOUTH EVERY DAY 90 tablet 1  ? Flaxseed, Linseed, (FLAX SEED OIL) 1000 MG CAPS Take by mouth.    ? glucosamine-chondroitin 500-400 MG tablet Take 1 tablet by mouth daily.    ? hydrochlorothiazide (HYDRODIURIL) 25 MG tablet Take 1 tablet (25 mg total) by mouth daily. 90 tablet 0  ? omeprazole (PRILOSEC) 40 MG capsule Take 1 capsule (40 mg total) by mouth daily. 90 capsule 3  ? potassium chloride SA (KLOR-CON M20) 20 MEQ tablet Take 2 tablets (40 mEq total) by mouth daily. 180 tablet 0  ? pravastatin (PRAVACHOL) 40 MG tablet Take 1 tablet (40 mg total) by mouth daily. 90 tablet 1  ? traMADol (ULTRAM) 50 MG tablet Take 1 tablet (50 mg total) by mouth every 6 (six) hours as needed. 30 tablet 0  ? vitamin B-12 (CYANOCOBALAMIN) 1000 MCG tablet Take 1,000 mcg by mouth daily.    ? levothyroxine (SYNTHROID) 112 MCG tablet Take 1 tablet (112 mcg total) by mouth daily. 90 tablet 1  ? ?No facility-administered medications prior to visit.  ? ? ?Allergies  ?Allergen Reactions  ? Bactrim [Sulfamethoxazole-Trimethoprim] Hives  ? Niacin And Related Nausea Only  ?  Hot flashes per patient  ? Simvastatin   ?  REACTION: cramps  ? ? ?Review of Systems  ?Constitutional:   Negative for fever.  ?HENT:  Negative for congestion, sinus pain and sore throat.   ?Respiratory:  Negative for cough, shortness of breath and wheezing.   ?Cardiovascular:  Positive for chest pain (mild, occasional). Negative  for palpitations.  ?Gastrointestinal:  Negative for blood in stool, constipation, diarrhea, nausea and vomiting.  ?Genitourinary:  Negative for dysuria, frequency and hematuria.  ?Musculoskeletal:  Positive for neck pain. Negative for joint pain and myalgias.  ?     (+)right foot pain  ?Skin:   ?     (-)New moles  ?Neurological:  Negative for headaches.  ? ?   ?Objective:  ?  ?Physical Exam ?Constitutional:   ?   General: She is not in acute distress. ?   Appearance: Normal appearance. She is not ill-appearing.  ?HENT:  ?   Head: Normocephalic and atraumatic.  ?   Right Ear: Tympanic membrane, ear canal and external ear normal.  ?   Left Ear: Tympanic membrane, ear canal and external ear normal.  ?   Mouth/Throat:  ?   Mouth: Mucous membranes are moist.  ?   Pharynx: Oropharynx is clear. No posterior oropharyngeal erythema.  ?Eyes:  ?   Extraocular Movements: Extraocular movements intact.  ?   Pupils: Pupils are equal, round, and reactive to light.  ?Cardiovascular:  ?   Rate and Rhythm: Normal rate and regular rhythm.  ?   Heart sounds: Normal heart sounds. No murmur heard. ?  No gallop.  ?   Comments: EKG results showed tachycardia ?Pulmonary:  ?   Effort: Pulmonary effort is normal. No respiratory distress.  ?   Breath sounds: Normal breath sounds. No wheezing or rales.  ?Abdominal:  ?   General: Bowel sounds are normal. There is no distension.  ?   Palpations: Abdomen is soft.  ?   Tenderness: There is no abdominal tenderness. There is no guarding.  ?Skin: ?   General: Skin is warm and dry.  ?Neurological:  ?   Mental Status: She is alert and oriented to person, place, and time.  ?Psychiatric:     ?   Judgment: Judgment normal.  ? ? ?BP 120/80 (BP Location: Left Arm, Patient Position:  Sitting, Cuff Size: Large)   Pulse (!) 105   Temp 97.8 ?F (36.6 ?C) (Oral)   Resp 18   Ht '5\' 3"'$  (1.6 m)   Wt 212 lb 6.4 oz (96.3 kg)   SpO2 96%   BMI 37.62 kg/m?  ?Wt Readings from Last 3 Encounters:  ?04/25

## 2021-09-13 NOTE — Assessment & Plan Note (Signed)
Encourage heart healthy diet such as MIND or DASH diet, increase exercise, avoid trans fats, simple carbohydrates and processed foods, consider a krill or fish or flaxseed oil cap daily.  °

## 2021-09-19 ENCOUNTER — Encounter: Payer: Self-pay | Admitting: Family Medicine

## 2021-09-20 DIAGNOSIS — M1712 Unilateral primary osteoarthritis, left knee: Secondary | ICD-10-CM | POA: Diagnosis not present

## 2021-09-20 DIAGNOSIS — M25562 Pain in left knee: Secondary | ICD-10-CM | POA: Diagnosis not present

## 2021-09-21 ENCOUNTER — Other Ambulatory Visit: Payer: Self-pay

## 2021-09-21 ENCOUNTER — Other Ambulatory Visit: Payer: Self-pay | Admitting: Family Medicine

## 2021-09-21 DIAGNOSIS — E785 Hyperlipidemia, unspecified: Secondary | ICD-10-CM

## 2021-09-21 DIAGNOSIS — E559 Vitamin D deficiency, unspecified: Secondary | ICD-10-CM

## 2021-09-21 MED ORDER — PRAVASTATIN SODIUM 80 MG PO TABS: 80.0000 mg | ORAL_TABLET | Freq: Every day | ORAL | 2 refills | Status: DC

## 2021-09-26 MED ORDER — VITAMIN D (ERGOCALCIFEROL) 1.25 MG (50000 UNIT) PO CAPS: 50000.0000 [IU] | ORAL_CAPSULE | ORAL | 1 refills | Status: DC

## 2021-10-14 ENCOUNTER — Other Ambulatory Visit: Payer: Self-pay | Admitting: Family Medicine

## 2021-10-14 DIAGNOSIS — R600 Localized edema: Secondary | ICD-10-CM

## 2021-10-18 ENCOUNTER — Other Ambulatory Visit: Payer: Self-pay | Admitting: Family Medicine

## 2021-10-18 DIAGNOSIS — E785 Hyperlipidemia, unspecified: Secondary | ICD-10-CM

## 2021-11-01 DIAGNOSIS — R002 Palpitations: Secondary | ICD-10-CM | POA: Diagnosis not present

## 2021-12-01 DIAGNOSIS — R002 Palpitations: Secondary | ICD-10-CM | POA: Diagnosis not present

## 2021-12-02 ENCOUNTER — Other Ambulatory Visit: Payer: Self-pay | Admitting: Family Medicine

## 2021-12-02 DIAGNOSIS — M23303 Other meniscus derangements, unspecified medial meniscus, right knee: Secondary | ICD-10-CM | POA: Diagnosis not present

## 2021-12-02 DIAGNOSIS — R600 Localized edema: Secondary | ICD-10-CM

## 2021-12-02 DIAGNOSIS — M2241 Chondromalacia patellae, right knee: Secondary | ICD-10-CM | POA: Diagnosis not present

## 2021-12-05 DIAGNOSIS — R9430 Abnormal result of cardiovascular function study, unspecified: Secondary | ICD-10-CM | POA: Diagnosis not present

## 2021-12-05 DIAGNOSIS — R002 Palpitations: Secondary | ICD-10-CM | POA: Diagnosis not present

## 2021-12-11 ENCOUNTER — Encounter: Payer: Self-pay | Admitting: Family Medicine

## 2021-12-14 ENCOUNTER — Other Ambulatory Visit (INDEPENDENT_AMBULATORY_CARE_PROVIDER_SITE_OTHER): Payer: BC Managed Care – PPO

## 2021-12-14 DIAGNOSIS — E559 Vitamin D deficiency, unspecified: Secondary | ICD-10-CM | POA: Diagnosis not present

## 2021-12-14 DIAGNOSIS — E785 Hyperlipidemia, unspecified: Secondary | ICD-10-CM | POA: Diagnosis not present

## 2021-12-14 LAB — COMPREHENSIVE METABOLIC PANEL
ALT: 22 U/L (ref 0–35)
AST: 18 U/L (ref 0–37)
Albumin: 4.5 g/dL (ref 3.5–5.2)
Alkaline Phosphatase: 103 U/L (ref 39–117)
BUN: 15 mg/dL (ref 6–23)
CO2: 34 mEq/L — ABNORMAL HIGH (ref 19–32)
Calcium: 9.7 mg/dL (ref 8.4–10.5)
Chloride: 100 mEq/L (ref 96–112)
Creatinine, Ser: 0.93 mg/dL (ref 0.40–1.20)
GFR: 67.74 mL/min (ref >60.00)
Glucose, Bld: 95 mg/dL (ref 70–99)
Potassium: 4.1 mEq/L (ref 3.5–5.1)
Sodium: 140 mEq/L (ref 135–145)
Total Bilirubin: 0.5 mg/dL (ref 0.2–1.2)
Total Protein: 7.1 g/dL (ref 6.0–8.3)

## 2021-12-14 LAB — LIPID PANEL
Cholesterol: 235 mg/dL — ABNORMAL HIGH (ref 0–200)
HDL: 67.9 mg/dL (ref >39.00)
LDL Cholesterol: 144 mg/dL — ABNORMAL HIGH (ref 0–99)
NonHDL: 167.46
Total CHOL/HDL Ratio: 3
Triglycerides: 119 mg/dL (ref 0.0–149.0)
VLDL: 23.8 mg/dL (ref 0.0–40.0)

## 2021-12-14 LAB — VITAMIN D 25 HYDROXY (VIT D DEFICIENCY, FRACTURES): VITD: 16.22 ng/mL — ABNORMAL LOW (ref 30.00–100.00)

## 2021-12-16 DIAGNOSIS — R7303 Prediabetes: Secondary | ICD-10-CM | POA: Diagnosis not present

## 2021-12-16 DIAGNOSIS — I2583 Coronary atherosclerosis due to lipid rich plaque: Secondary | ICD-10-CM | POA: Diagnosis not present

## 2021-12-16 DIAGNOSIS — E669 Obesity, unspecified: Secondary | ICD-10-CM | POA: Diagnosis not present

## 2021-12-16 DIAGNOSIS — E785 Hyperlipidemia, unspecified: Secondary | ICD-10-CM | POA: Diagnosis not present

## 2021-12-16 DIAGNOSIS — I2510 Atherosclerotic heart disease of native coronary artery without angina pectoris: Secondary | ICD-10-CM | POA: Diagnosis not present

## 2021-12-16 DIAGNOSIS — Z79899 Other long term (current) drug therapy: Secondary | ICD-10-CM | POA: Diagnosis not present

## 2021-12-16 DIAGNOSIS — Z6837 Body mass index (BMI) 37.0-37.9, adult: Secondary | ICD-10-CM | POA: Diagnosis not present

## 2021-12-16 DIAGNOSIS — R9439 Abnormal result of other cardiovascular function study: Secondary | ICD-10-CM | POA: Diagnosis not present

## 2021-12-16 DIAGNOSIS — Z7982 Long term (current) use of aspirin: Secondary | ICD-10-CM | POA: Diagnosis not present

## 2021-12-16 DIAGNOSIS — I10 Essential (primary) hypertension: Secondary | ICD-10-CM | POA: Diagnosis not present

## 2021-12-19 ENCOUNTER — Other Ambulatory Visit: Payer: BC Managed Care – PPO

## 2021-12-20 ENCOUNTER — Encounter: Payer: Self-pay | Admitting: Family Medicine

## 2021-12-20 ENCOUNTER — Other Ambulatory Visit: Payer: Self-pay | Admitting: Family Medicine

## 2021-12-20 MED ORDER — VITAMIN D (ERGOCALCIFEROL) 1.25 MG (50000 UNIT) PO CAPS: 50000.0000 [IU] | ORAL_CAPSULE | ORAL | 1 refills | Status: DC

## 2021-12-28 ENCOUNTER — Other Ambulatory Visit: Payer: Self-pay

## 2022-02-26 ENCOUNTER — Other Ambulatory Visit: Payer: Self-pay | Admitting: Family Medicine

## 2022-02-26 DIAGNOSIS — K219 Gastro-esophageal reflux disease without esophagitis: Secondary | ICD-10-CM

## 2022-03-20 ENCOUNTER — Other Ambulatory Visit: Payer: Self-pay | Admitting: Family Medicine

## 2022-04-08 ENCOUNTER — Other Ambulatory Visit: Payer: Self-pay | Admitting: Family Medicine

## 2022-04-08 DIAGNOSIS — E039 Hypothyroidism, unspecified: Secondary | ICD-10-CM

## 2022-04-08 DIAGNOSIS — R600 Localized edema: Secondary | ICD-10-CM

## 2022-04-29 ENCOUNTER — Other Ambulatory Visit: Payer: Self-pay | Admitting: Family Medicine

## 2022-04-29 DIAGNOSIS — E785 Hyperlipidemia, unspecified: Secondary | ICD-10-CM

## 2022-06-27 ENCOUNTER — Encounter: Payer: Self-pay | Admitting: Family Medicine

## 2022-06-27 DIAGNOSIS — Z1231 Encounter for screening mammogram for malignant neoplasm of breast: Secondary | ICD-10-CM

## 2022-06-27 MED ORDER — VITAMIN D (ERGOCALCIFEROL) 1.25 MG (50000 UNIT) PO CAPS: 50000.0000 [IU] | ORAL_CAPSULE | ORAL | 1 refills | Status: AC

## 2022-08-09 LAB — HM MAMMOGRAPHY

## 2022-10-10 ENCOUNTER — Other Ambulatory Visit: Payer: Self-pay | Admitting: Family Medicine

## 2022-10-10 DIAGNOSIS — R600 Localized edema: Secondary | ICD-10-CM

## 2022-10-20 ENCOUNTER — Other Ambulatory Visit: Payer: Self-pay | Admitting: Family Medicine

## 2022-10-20 DIAGNOSIS — E785 Hyperlipidemia, unspecified: Secondary | ICD-10-CM

## 2022-12-16 ENCOUNTER — Other Ambulatory Visit: Payer: Self-pay | Admitting: Family Medicine

## 2023-02-11 ENCOUNTER — Other Ambulatory Visit: Payer: Self-pay | Admitting: Family Medicine

## 2023-02-11 DIAGNOSIS — R600 Localized edema: Secondary | ICD-10-CM

## 2023-02-15 ENCOUNTER — Other Ambulatory Visit: Payer: Self-pay | Admitting: Family Medicine

## 2023-02-15 DIAGNOSIS — R600 Localized edema: Secondary | ICD-10-CM

## 2023-02-15 DIAGNOSIS — K219 Gastro-esophageal reflux disease without esophagitis: Secondary | ICD-10-CM

## 2023-03-23 ENCOUNTER — Other Ambulatory Visit: Payer: Self-pay | Admitting: Family Medicine

## 2023-05-14 ENCOUNTER — Other Ambulatory Visit: Payer: Self-pay | Admitting: Family Medicine

## 2023-05-14 DIAGNOSIS — E785 Hyperlipidemia, unspecified: Secondary | ICD-10-CM

## 2023-08-11 ENCOUNTER — Other Ambulatory Visit: Payer: Self-pay | Admitting: Family Medicine

## 2023-08-11 DIAGNOSIS — K219 Gastro-esophageal reflux disease without esophagitis: Secondary | ICD-10-CM

## 2023-11-13 ENCOUNTER — Other Ambulatory Visit: Payer: Self-pay | Admitting: Family Medicine

## 2023-11-13 DIAGNOSIS — K219 Gastro-esophageal reflux disease without esophagitis: Secondary | ICD-10-CM
